# Patient Record
Sex: Female | Born: 1955 | Race: Black or African American | Hispanic: No | Marital: Married | Smoking: Never smoker
Health system: Southern US, Community
[De-identification: ages and names within clinical notes are randomized; demographics above are authoritative.]

## PROBLEM LIST (undated history)

## (undated) DIAGNOSIS — E785 Hyperlipidemia, unspecified: Secondary | ICD-10-CM

## (undated) DIAGNOSIS — D72819 Decreased white blood cell count, unspecified: Secondary | ICD-10-CM

## (undated) DIAGNOSIS — R202 Paresthesia of skin: Secondary | ICD-10-CM

## (undated) DIAGNOSIS — D696 Thrombocytopenia, unspecified: Secondary | ICD-10-CM

## (undated) DIAGNOSIS — G5602 Carpal tunnel syndrome, left upper limb: Secondary | ICD-10-CM

## (undated) DIAGNOSIS — D1803 Hemangioma of intra-abdominal structures: Secondary | ICD-10-CM

## (undated) DIAGNOSIS — K635 Polyp of colon: Secondary | ICD-10-CM

## (undated) DIAGNOSIS — E039 Hypothyroidism, unspecified: Secondary | ICD-10-CM

## (undated) DIAGNOSIS — F419 Anxiety disorder, unspecified: Secondary | ICD-10-CM

## (undated) DIAGNOSIS — M8580 Other specified disorders of bone density and structure, unspecified site: Secondary | ICD-10-CM

## (undated) DIAGNOSIS — K219 Gastro-esophageal reflux disease without esophagitis: Secondary | ICD-10-CM

## (undated) DIAGNOSIS — E049 Nontoxic goiter, unspecified: Secondary | ICD-10-CM

## (undated) HISTORY — DX: Decreased white blood cell count, unspecified: D72.819

## (undated) HISTORY — DX: Polyp of colon: K63.5

## (undated) HISTORY — DX: Hypothyroidism, unspecified: E03.9

## (undated) HISTORY — DX: Paresthesia of skin: R20.2

## (undated) HISTORY — DX: Carpal tunnel syndrome, left upper limb: G56.02

## (undated) HISTORY — DX: Hyperlipidemia, unspecified: E78.5

## (undated) HISTORY — DX: Hemangioma of intra-abdominal structures: D18.03

## (undated) HISTORY — PX: MOUTH SURGERY: SHX715

## (undated) HISTORY — DX: Other specified disorders of bone density and structure, unspecified site: M85.80

## (undated) HISTORY — DX: Gastro-esophageal reflux disease without esophagitis: K21.9

## (undated) HISTORY — DX: Thrombocytopenia, unspecified: D69.6

## (undated) HISTORY — DX: Nontoxic goiter, unspecified: E04.9

## (undated) HISTORY — DX: Anxiety disorder, unspecified: F41.9

---

## 1999-09-18 ENCOUNTER — Other Ambulatory Visit: Admission: RE | Admit: 1999-09-18 | Discharge: 1999-09-18 | Payer: Self-pay | Admitting: Family Medicine

## 2000-10-25 ENCOUNTER — Other Ambulatory Visit: Admission: RE | Admit: 2000-10-25 | Discharge: 2000-10-25 | Payer: Self-pay | Admitting: Family Medicine

## 2000-10-26 ENCOUNTER — Encounter: Admission: RE | Admit: 2000-10-26 | Discharge: 2000-10-26 | Payer: Self-pay | Admitting: Family Medicine

## 2000-10-26 ENCOUNTER — Encounter: Payer: Self-pay | Admitting: Family Medicine

## 2000-11-16 ENCOUNTER — Encounter: Payer: Self-pay | Admitting: Family Medicine

## 2000-11-16 ENCOUNTER — Ambulatory Visit (HOSPITAL_COMMUNITY): Admission: RE | Admit: 2000-11-16 | Discharge: 2000-11-16 | Payer: Self-pay | Admitting: Family Medicine

## 2001-02-21 ENCOUNTER — Encounter: Admission: RE | Admit: 2001-02-21 | Discharge: 2001-02-21 | Payer: Self-pay | Admitting: *Deleted

## 2001-11-30 ENCOUNTER — Other Ambulatory Visit: Admission: RE | Admit: 2001-11-30 | Discharge: 2001-11-30 | Payer: Self-pay | Admitting: Family Medicine

## 2002-07-25 ENCOUNTER — Encounter: Payer: Self-pay | Admitting: Family Medicine

## 2002-07-25 ENCOUNTER — Encounter: Admission: RE | Admit: 2002-07-25 | Discharge: 2002-07-25 | Payer: Self-pay | Admitting: Family Medicine

## 2003-01-18 ENCOUNTER — Other Ambulatory Visit: Admission: RE | Admit: 2003-01-18 | Discharge: 2003-01-18 | Payer: Self-pay | Admitting: Family Medicine

## 2003-01-29 ENCOUNTER — Encounter: Admission: RE | Admit: 2003-01-29 | Discharge: 2003-01-29 | Payer: Self-pay | Admitting: Family Medicine

## 2003-01-29 ENCOUNTER — Encounter: Payer: Self-pay | Admitting: Family Medicine

## 2004-04-07 ENCOUNTER — Encounter: Admission: RE | Admit: 2004-04-07 | Discharge: 2004-04-07 | Payer: Self-pay | Admitting: Family Medicine

## 2005-07-19 ENCOUNTER — Other Ambulatory Visit: Admission: RE | Admit: 2005-07-19 | Discharge: 2005-07-19 | Payer: Self-pay | Admitting: Family Medicine

## 2007-08-29 ENCOUNTER — Encounter: Admission: RE | Admit: 2007-08-29 | Discharge: 2007-08-29 | Payer: Self-pay | Admitting: Family Medicine

## 2007-09-01 ENCOUNTER — Encounter: Admission: RE | Admit: 2007-09-01 | Discharge: 2007-09-01 | Payer: Self-pay | Admitting: Gastroenterology

## 2007-10-24 ENCOUNTER — Encounter: Admission: RE | Admit: 2007-10-24 | Discharge: 2007-10-24 | Payer: Self-pay | Admitting: Family Medicine

## 2008-06-28 ENCOUNTER — Encounter: Admission: RE | Admit: 2008-06-28 | Discharge: 2008-06-28 | Payer: Self-pay | Admitting: Family Medicine

## 2009-01-07 ENCOUNTER — Encounter: Admission: RE | Admit: 2009-01-07 | Discharge: 2009-01-07 | Payer: Self-pay | Admitting: Internal Medicine

## 2009-04-17 HISTORY — PX: THYROIDECTOMY, PARTIAL: SHX18

## 2009-04-17 HISTORY — PX: TOTAL THYROIDECTOMY: SHX2547

## 2009-04-18 ENCOUNTER — Ambulatory Visit (HOSPITAL_COMMUNITY): Admission: RE | Admit: 2009-04-18 | Discharge: 2009-04-19 | Payer: Self-pay | Admitting: Surgery

## 2009-04-18 ENCOUNTER — Encounter (INDEPENDENT_AMBULATORY_CARE_PROVIDER_SITE_OTHER): Payer: Self-pay | Admitting: Surgery

## 2010-10-20 ENCOUNTER — Ambulatory Visit: Admit: 2010-10-20 | Payer: Self-pay | Admitting: Internal Medicine

## 2010-10-22 ENCOUNTER — Ambulatory Visit
Admission: RE | Admit: 2010-10-22 | Discharge: 2010-10-22 | Payer: Self-pay | Source: Home / Self Care | Attending: Internal Medicine | Admitting: Internal Medicine

## 2010-11-03 ENCOUNTER — Ambulatory Visit: Payer: Self-pay | Admitting: Internal Medicine

## 2010-12-31 ENCOUNTER — Other Ambulatory Visit: Payer: Federal, State, Local not specified - PPO | Admitting: Internal Medicine

## 2010-12-31 ENCOUNTER — Ambulatory Visit: Payer: Self-pay | Admitting: Internal Medicine

## 2010-12-31 DIAGNOSIS — E7800 Pure hypercholesterolemia, unspecified: Secondary | ICD-10-CM

## 2011-01-07 ENCOUNTER — Ambulatory Visit: Payer: Federal, State, Local not specified - PPO | Admitting: Internal Medicine

## 2011-01-07 DIAGNOSIS — E039 Hypothyroidism, unspecified: Secondary | ICD-10-CM

## 2011-01-07 DIAGNOSIS — E789 Disorder of lipoprotein metabolism, unspecified: Secondary | ICD-10-CM

## 2011-01-24 LAB — CALCIUM: Calcium: 9.2 mg/dL (ref 8.4–10.5)

## 2011-01-25 LAB — BASIC METABOLIC PANEL
CO2: 32 mEq/L (ref 19–32)
Calcium: 9.4 mg/dL (ref 8.4–10.5)
Creatinine, Ser: 0.84 mg/dL (ref 0.4–1.2)
Glucose, Bld: 93 mg/dL (ref 70–99)
Potassium: 4.3 mEq/L (ref 3.5–5.1)

## 2011-01-25 LAB — DIFFERENTIAL
Basophils Absolute: 0 10*3/uL (ref 0.0–0.1)
Basophils Relative: 0 % (ref 0–1)
Eosinophils Absolute: 0 10*3/uL (ref 0.0–0.7)
Eosinophils Relative: 1 % (ref 0–5)
Lymphocytes Relative: 36 % (ref 12–46)
Monocytes Absolute: 0.3 10*3/uL (ref 0.1–1.0)
Neutrophils Relative %: 54 % (ref 43–77)

## 2011-01-25 LAB — URINE MICROSCOPIC-ADD ON

## 2011-01-25 LAB — PROTIME-INR
INR: 1 (ref 0.00–1.49)
Prothrombin Time: 13.9 seconds (ref 11.6–15.2)

## 2011-01-25 LAB — URINALYSIS, ROUTINE W REFLEX MICROSCOPIC
Bilirubin Urine: NEGATIVE
Hgb urine dipstick: NEGATIVE
Nitrite: NEGATIVE
Protein, ur: NEGATIVE mg/dL
Specific Gravity, Urine: 1.014 (ref 1.005–1.030)

## 2011-01-25 LAB — CBC
Hemoglobin: 13.5 g/dL (ref 12.0–15.0)
RBC: 4.24 MIL/uL (ref 3.87–5.11)

## 2011-03-02 NOTE — Op Note (Signed)
[name], [name]                ACCOUNT NO.:  [id number]   MEDICAL RECORD NO.:  [id number]          PATIENT TYPE:  OIB   LOCATION:  5151                         FACILITY:  [facility]   PHYSICIAN:  Velora Heckler, MD      DATE OF BIRTH:  Feb 16, 1956   DATE OF PROCEDURE:  04/18/2009  DATE OF DISCHARGE:                               OPERATIVE REPORT   PREOPERATIVE DIAGNOSIS:  Thyroid goiter, dominant left thyroid nodule.   POSTOPERATIVE DIAGNOSIS:  Thyroid goiter, dominant left thyroid nodule.   PROCEDURE:  Total thyroidectomy.   SURGEON:  Velora Heckler, MD, FACS   ASSISTANT:  Adolph Pollack, MD, FACS   ANESTHESIA:  General per Bedelia Person, MD   ESTIMATED BLOOD LOSS:  Minimal.   PREPARATION:  ChloraPrep.   COMPLICATIONS:  None.   INDICATIONS:  The patient is a 55 year old black female from [place],  [place].  The patient has a known thyroid goiter with a dominant  left thyroid nodule.  She has been followed for approximately 5 years.  There has been a gradual increase in size of the nodules.  She has  developed mild compressive symptoms.  The patient now comes to surgery  for thyroidectomy.   BODY OF REPORT:  Procedure was done in OR #16 at the [facility].  The patient was brought to the operating room and  placed in the supine position on the operating room table.  Following  administration of general anesthesia, the patient was positioned and  then prepped and draped in the usual strict aseptic fashion.  After  ascertaining that an adequate level of anesthesia had been achieved, a  Kocher incision was made with a #15 blade.  Dissection was carried  through subcutaneous tissues and platysma.  Hemostasis was obtained with  the electrocautery.  Skin flaps were elevated cephalad and caudad from  the thyroid notch to the sternal notch.  A Mahorner self-retaining  retractor was placed for exposure.  Strap muscles were incised in the  midline.   Dissection was begun on the right side.  Right thyroid lobe  was exposed.  Strap muscles were reflected laterally.  Middle thyroid  vein was divided between medium Ligaclips with the harmonic scalpel.  Gland was gently mobilized with blunt dissection.  Inferior parathyroid  gland was identified on the thyroid capsule.  It was gently mobilized on  its vascular pedicle and preserved.  Superior pole vessels were  dissected out and divided between medium Ligaclips with the harmonic  scalpel.  Gland was rolled anteriorly.  Inferior venous tributaries were  ligated in continuity with 2-0 silk ties and divided with the harmonic  scalpel.  Gland was rolled further anteriorly.  Branches of the inferior  thyroid artery were divided between small Ligaclips with the harmonic  scalpel.  Recurrent nerve was identified and preserved.  The ligament of  Allyson Sabal was transected with the electrocautery, and the gland was  mobilized up and onto the anterior trachea.  Isthmus was mobilized  across the midline.  There was no pyramidal lobe.   Next,  we turned our attention to the left thyroid lobe.  Strap muscles  were again reflected laterally.  The left thyroid lobe was actually  larger in size with the inferior pole nodule extending into the anterior  mediastinum, but behind the head of the clavicle.  This was gently  mobilized with blunt dissection.  Middle thyroid vein was divided  between Ligaclips with the harmonic scalpel.  Inferior venous  tributaries were ligated in continuity with 2-0 silk ties and divided  with the harmonic scalpel.  Superior pole was dissected out.  Superior  parathyroid gland was identified and preserved.  Vascular tributaries to  the superior pole were divided between Ligaclips with the harmonic  scalpel.  Gland was rolled anteriorly.  Recurrent nerve was identified  and preserved.  Branches of the inferior thyroid artery were divided  between small Ligaclips.  Ligament of Allyson Sabal was  transected with the  electrocautery, and the gland was mobilized up and onto the anterior  trachea.  It was excised off the trachea using the harmonic scalpel for  hemostasis.  Sutures were used to mark the left superior pole.  The  entire thyroid gland was submitted to Pathology for review.   Neck was irrigated with warm saline.  Good hemostasis was achieved  throughout.  Surgicel was placed in the operative field bilaterally.  Strap muscles were reapproximated in the midline with interrupted 3-0  Vicryl sutures.  Platysma was closed with interrupted 3-0 Vicryl  sutures.  Skin was closed with running 4-0 Monocryl subcuticular suture.  Wound was washed and dried, and benzoin and Steri-Strips were applied.  Sterile dressings were applied.  The patient was awakened from  anesthesia and brought to the recovery room in stable condition.  The  patient tolerated the procedure well.      Velora Heckler, MD  Electronically Signed     TMG/MEDQ  D:  04/18/2009  T:  04/19/2009  Job:  [id number]   cc:   Kendrick Ranch, M.D.

## 2011-04-12 ENCOUNTER — Other Ambulatory Visit: Payer: Self-pay | Admitting: Internal Medicine

## 2011-04-12 ENCOUNTER — Other Ambulatory Visit: Payer: Self-pay

## 2011-04-12 ENCOUNTER — Ambulatory Visit (INDEPENDENT_AMBULATORY_CARE_PROVIDER_SITE_OTHER): Payer: Federal, State, Local not specified - PPO | Admitting: Internal Medicine

## 2011-04-12 DIAGNOSIS — Z113 Encounter for screening for infections with a predominantly sexual mode of transmission: Secondary | ICD-10-CM

## 2011-04-12 DIAGNOSIS — E785 Hyperlipidemia, unspecified: Secondary | ICD-10-CM

## 2011-04-12 DIAGNOSIS — M8580 Other specified disorders of bone density and structure, unspecified site: Secondary | ICD-10-CM

## 2011-04-12 DIAGNOSIS — K769 Liver disease, unspecified: Secondary | ICD-10-CM

## 2011-04-12 DIAGNOSIS — E039 Hypothyroidism, unspecified: Secondary | ICD-10-CM

## 2011-04-12 DIAGNOSIS — Z1272 Encounter for screening for malignant neoplasm of vagina: Secondary | ICD-10-CM

## 2011-04-12 DIAGNOSIS — Z01419 Encounter for gynecological examination (general) (routine) without abnormal findings: Secondary | ICD-10-CM

## 2011-04-12 DIAGNOSIS — M899 Disorder of bone, unspecified: Secondary | ICD-10-CM

## 2011-04-14 ENCOUNTER — Other Ambulatory Visit (HOSPITAL_BASED_OUTPATIENT_CLINIC_OR_DEPARTMENT_OTHER): Payer: Federal, State, Local not specified - PPO

## 2011-04-17 ENCOUNTER — Ambulatory Visit (HOSPITAL_BASED_OUTPATIENT_CLINIC_OR_DEPARTMENT_OTHER)
Admission: RE | Admit: 2011-04-17 | Discharge: 2011-04-17 | Disposition: A | Discharge status: Home / Self Care | Payer: Federal, State, Local not specified - PPO | Source: Ambulatory Visit | Attending: Internal Medicine | Admitting: Internal Medicine

## 2011-04-17 ENCOUNTER — Other Ambulatory Visit (HOSPITAL_BASED_OUTPATIENT_CLINIC_OR_DEPARTMENT_OTHER): Payer: Federal, State, Local not specified - PPO

## 2011-04-17 DIAGNOSIS — Q619 Cystic kidney disease, unspecified: Secondary | ICD-10-CM

## 2011-04-17 DIAGNOSIS — K7689 Other specified diseases of liver: Secondary | ICD-10-CM

## 2011-04-17 DIAGNOSIS — K769 Liver disease, unspecified: Secondary | ICD-10-CM

## 2011-04-17 MED ORDER — GADOBENATE DIMEGLUMINE 529 MG/ML IV SOLN
13.0000 mL | Freq: Once | INTRAVENOUS | Status: AC | PRN
  Administered 2011-04-17: 13 mL via INTRAVENOUS

## 2011-04-19 ENCOUNTER — Ambulatory Visit
Admission: RE | Admit: 2011-04-19 | Discharge: 2011-04-19 | Disposition: A | Discharge status: Home / Self Care | Payer: Federal, State, Local not specified - PPO | Source: Ambulatory Visit | Attending: Internal Medicine | Admitting: Internal Medicine

## 2011-04-19 DIAGNOSIS — M8580 Other specified disorders of bone density and structure, unspecified site: Secondary | ICD-10-CM

## 2011-04-26 ENCOUNTER — Encounter: Payer: Self-pay | Admitting: Internal Medicine

## 2011-04-26 DIAGNOSIS — M8580 Other specified disorders of bone density and structure, unspecified site: Secondary | ICD-10-CM | POA: Insufficient documentation

## 2011-06-28 ENCOUNTER — Encounter: Payer: Self-pay | Admitting: Emergency Medicine

## 2011-07-21 ENCOUNTER — Ambulatory Visit (INDEPENDENT_AMBULATORY_CARE_PROVIDER_SITE_OTHER): Payer: Federal, State, Local not specified - PPO | Admitting: Family Medicine

## 2011-07-21 ENCOUNTER — Encounter: Payer: Self-pay | Admitting: Internal Medicine

## 2011-07-21 VITALS — BP 112/67 | HR 76 | Temp 97.1°F | Resp 12 | Ht 64.25 in | Wt 148.0 lb

## 2011-07-21 DIAGNOSIS — S90121A Contusion of right lesser toe(s) without damage to nail, initial encounter: Secondary | ICD-10-CM

## 2011-07-21 DIAGNOSIS — S90129A Contusion of unspecified lesser toe(s) without damage to nail, initial encounter: Secondary | ICD-10-CM

## 2011-07-21 DIAGNOSIS — J00 Acute nasopharyngitis [common cold]: Secondary | ICD-10-CM

## 2011-07-21 NOTE — Progress Notes (Addendum)
Subjective:    Patient ID: [name], female    DOB: [birth year], 55 y.o.   MRN: [id number]  HPI Patient is a 55-yo AA Female who presents with about 1 week history of cough, some mild congestion, and headaches. She states the worst day was Friday and she missed work that day, it was 5 days ago. She does not have a head ache or sinus pain today. She has a minimally runny nose. She states she has a cough productive of yellow phlegm. She has been using OTC pseudaphed and robitussin, and Nedi Pot for her symptoms. She has had no fevers. She states her husband had a cold too, but no other sick contacts. She has not had her flu shot yet this year.  Additionally, [name] states she stubbed the small toe of her RT foot and was concerned about it. She has been wearing tennis shoes and icing/elevating her foot with decent relief. She is post menopausal but does not calcium or vitamin d.   Review of Systems  Constitutional: Positive for fatigue. Negative for fever, chills, diaphoresis, activity change and appetite change.  HENT: Positive for congestion, rhinorrhea and postnasal drip. Negative for ear pain, nosebleeds, sore throat, facial swelling, sneezing, trouble swallowing, neck pain, neck stiffness, voice change, sinus pressure and ear discharge.   Eyes: Negative for discharge.  Respiratory: Positive for cough. Negative for apnea, choking, chest tightness, shortness of breath, wheezing and stridor.   Cardiovascular: Negative for chest pain.  Gastrointestinal: Negative for nausea and diarrhea.  Musculoskeletal: Negative for myalgias, joint swelling and arthralgias.       Pain of RT 5th toe and along the lateral edge of her foot  Skin: Negative for color change.  Neurological: Negative for dizziness and headaches.       Objective:   Physical Exam  Constitutional: She is oriented to person, place, and time. She appears well-developed and well-nourished. No distress.  HENT:  Head:  Normocephalic and atraumatic.  Right Ear: External ear normal.  Left Ear: External ear normal.  Nose: Nose normal.  Mouth/Throat: Oropharynx is clear and moist. No oropharyngeal exudate.       Small amount of clear fluid noted behind Right ear drum  Eyes: Pupils are equal, round, and reactive to light.  Neck: Normal range of motion.  Cardiovascular: Normal rate, regular rhythm, normal heart sounds and intact distal pulses.  Exam reveals no gallop and no friction rub.   No murmur heard. Pulmonary/Chest: Effort normal and breath sounds normal. No respiratory distress. She has no wheezes. She has no rales.  Abdominal: Soft. Bowel sounds are normal.  Musculoskeletal: Normal range of motion.       RIGHT FOOT: Tenderness along the shaft of the 5th metatarsal. No swelling or deformity noted. Movement/motion of 5th digit normal and did not illicit pain.  Lymphadenopathy:    She has no cervical adenopathy.  Neurological: She is alert and oriented to person, place, and time. She has normal reflexes. No cranial nerve deficit.  Skin: Skin is warm and dry. No rash noted. She is not diaphoretic.          Assessment & Plan:  1. Common Cold: Patient has been treating her symptoms well with OTC medications. She does not have any findings on exam to suggest current need for antibiotics. Discussed continuing her OTC regimen and if her symptoms fail to continue to improve or worsen, or she develops fever, she should follow-up closely. Feel that patient should wait until  she feels better from current symptoms before she gets her flu vaccine so that it does not confuse her current clinical picture if she develops the commonly associated "flu like illness" from the flu vaccination.  2. Contusion Rt 5th Toe: Discussed role of xray to rule out a stress fracture; she is post menopausal and is not on calcium/vit d. She declines xray at this time, but will call back if her foot becomes swollen or symptoms worsen.  Discussed continuing to wear appropriate shoes, use of ice and elevation.  Lucina Mellow, DO Family Practice Physician

## 2011-07-21 NOTE — Patient Instructions (Signed)
Common Cold, Adult An upper respiratory tract infection, or cold, is a viral infection of the air passages to the lung. Colds are contagious, especially during the first 3 or 4 days. Antibiotics cannot cure a cold. Cold germs are spread by coughs, sneezes, and hand to hand contact. A respiratory tract infection usually clears up in a few days, but some people may be sick for a week or two. HOME CARE INSTRUCTIONS  Only take over-the-counter or prescription medicines for pain, discomfort, or fever as directed by your caregiver.   Be careful not to blow your nose too hard. This may cause a nosebleed.   Use a cool-mist humidifier (vaporizer) to increase air moisture. This will make it easier for you to breath. Do not use hot steam.   Rest as much as possible and get plenty of sleep.   Wash your hands often, especially after you blow your nose. Cover your mouth and nose with a tissue when you sneeze or cough.   Drink at least 8 glasses of clear liquids every day, such as water, fruit juices, tea, clear soups, and carbonated beverages.  SEEK MEDICAL CARE IF:  An oral temperature above 100.4 F lasts 4 days or more, and is not controlled by medication.   You have a sore throat that gets worse or you see white or yellow spots in your throat.   Your cough gets worse or lasts more than 10 days.   You have a rash somewhere on your skin. You have large and tender lumps in your neck.   You have an earache or a headache.   You have thick, greenish or yellowish discharge from your nose.   You cough-up thick yellow, green, gray or bloody mucus (secretions).  SEEK IMMEDIATE MEDICAL CARE IF: You have trouble breathing, chest pain, or your skin or nails look gray or blue. MAKE SURE YOU:   Understand these instructions.   Will watch your condition.   Will get help right away if you are not doing well or get worse.  Document Released: 10/01/2000 Document Re-Released: 09/16/2008 [facility] Patient  Information 2011 [place], [place].

## 2011-07-27 ENCOUNTER — Telehealth: Payer: Self-pay | Admitting: Internal Medicine

## 2011-07-27 ENCOUNTER — Other Ambulatory Visit: Payer: Self-pay | Admitting: Internal Medicine

## 2011-07-27 ENCOUNTER — Ambulatory Visit (HOSPITAL_BASED_OUTPATIENT_CLINIC_OR_DEPARTMENT_OTHER)
Admission: RE | Admit: 2011-07-27 | Discharge: 2011-07-27 | Disposition: A | Discharge status: Home / Self Care | Payer: Federal, State, Local not specified - PPO | Source: Ambulatory Visit | Attending: Internal Medicine | Admitting: Internal Medicine

## 2011-07-27 DIAGNOSIS — S99929A Unspecified injury of unspecified foot, initial encounter: Secondary | ICD-10-CM

## 2011-07-27 DIAGNOSIS — S92919A Unspecified fracture of unspecified toe(s), initial encounter for closed fracture: Secondary | ICD-10-CM | POA: Insufficient documentation

## 2011-07-27 DIAGNOSIS — X58XXXA Exposure to other specified factors, initial encounter: Secondary | ICD-10-CM | POA: Insufficient documentation

## 2011-07-27 NOTE — Telephone Encounter (Signed)
Pt would like results of X Ray done on 07/27/2011.  Call back phone number [phone] cell and [phone] home.

## 2011-07-27 NOTE — Telephone Encounter (Signed)
Pt states she was told by Dr. Natale Milch that if the pain does not get any better in her right baby toe to call back to get an xray; she called back this morning to get one. She would like one today, best contact is [phone]. Thanks

## 2011-07-27 NOTE — Telephone Encounter (Signed)
No answer on home phone, no answering machine.  Left message on [name]'s cell phone, okay to come in for x-ray of foot at her convenience, no appointment needed.  She is to call with any questions

## 2011-07-27 NOTE — Telephone Encounter (Signed)
Pt would like to know results of xray.  Aware it will be morning before DDS reviews

## 2011-07-27 NOTE — Telephone Encounter (Signed)
See Dr. Vira Blanco note- okay to order xray?

## 2011-07-27 NOTE — Telephone Encounter (Signed)
I ordered X-ray  For her

## 2011-07-28 ENCOUNTER — Telehealth: Payer: Self-pay | Admitting: Internal Medicine

## 2011-07-28 NOTE — Telephone Encounter (Signed)
Spoke with pt and infomed of oblique fracture of toe.  Pain better.  Advised to pad the toe and wear flat soft support shoes.  Will refer to Dr. Charlett Blake to follow for healing.

## 2011-07-28 NOTE — Telephone Encounter (Signed)
Appointment scheduled with Dr. Charlett Blake Monday 10/15@ 330pm.  Pt aware and agreeable to appt.  Will fax records to [phone]

## 2011-08-14 ENCOUNTER — Other Ambulatory Visit: Payer: Self-pay | Admitting: Internal Medicine

## 2011-08-14 DIAGNOSIS — E785 Hyperlipidemia, unspecified: Secondary | ICD-10-CM

## 2011-11-04 ENCOUNTER — Telehealth: Payer: Self-pay | Admitting: Emergency Medicine

## 2011-11-04 DIAGNOSIS — E039 Hypothyroidism, unspecified: Secondary | ICD-10-CM

## 2011-11-04 MED ORDER — LEVOTHYROXINE SODIUM 50 MCG PO TABS: 50.0000 ug | ORAL_TABLET | Freq: Every day | ORAL | Status: DC

## 2011-11-04 NOTE — Telephone Encounter (Signed)
Call pt as she was supposed to come in to have TSH done back in august or September and I do not see result.  I will give her 30 days worth but she needs to have TSH done.   How long has she been out of meds???  TSH  With dx hypothyroidism  Thanks

## 2011-11-04 NOTE — Telephone Encounter (Signed)
Left message on voicemail that rx escribed at .  Also she will need labs, advised to call office for date.  She noted on the phone this morning that she had taken her last pill today.

## 2011-11-04 NOTE — Telephone Encounter (Signed)
[name] called, requested refill on levothyroxine.  She states she is completely out of medication

## 2011-11-08 NOTE — Telephone Encounter (Signed)
Spoke with [name], she is aware to go to [facility] for labs at her convenience before medication requires refill

## 2011-11-11 LAB — TSH: TSH: 16.396 u[IU]/mL — ABNORMAL HIGH (ref 0.350–4.500)

## 2011-11-15 ENCOUNTER — Telehealth: Payer: Self-pay | Admitting: Internal Medicine

## 2011-11-15 DIAGNOSIS — E039 Hypothyroidism, unspecified: Secondary | ICD-10-CM

## 2011-11-15 MED ORDER — LEVOTHYROXINE SODIUM 50 MCG PO TABS: ORAL_TABLET | ORAL | Status: DC

## 2011-11-15 NOTE — Telephone Encounter (Signed)
Spoke with pt about Elevated TSH.   Sheis to take 1 and 1/2 of 50 mcg M-Fri and only 1 talbet on Sat Sun.  She will see me in 2 months at CPE exam and will recheck TSH.  She voices understanding and states she will schedule CPE

## 2011-11-22 ENCOUNTER — Other Ambulatory Visit: Payer: Self-pay | Admitting: Internal Medicine

## 2011-11-22 DIAGNOSIS — E785 Hyperlipidemia, unspecified: Secondary | ICD-10-CM

## 2012-02-29 ENCOUNTER — Other Ambulatory Visit: Payer: Self-pay | Admitting: *Deleted

## 2012-02-29 DIAGNOSIS — E039 Hypothyroidism, unspecified: Secondary | ICD-10-CM

## 2012-02-29 NOTE — Telephone Encounter (Signed)
Received fax requesting refill, last refill 01/31/2012, last office visit 10/12

## 2012-03-02 MED ORDER — LEVOTHYROXINE SODIUM 50 MCG PO TABS: ORAL_TABLET | ORAL | Status: DC

## 2012-03-06 ENCOUNTER — Other Ambulatory Visit: Payer: Self-pay | Admitting: Internal Medicine

## 2012-03-07 NOTE — Telephone Encounter (Signed)
[name]  Call this pt and tell her that I refilled her cholesterol med for 30 days.  She needs to see me in office to recheck her liver studies and to check her thyroid studies as they are overdue.     Give her a 30 minute appt and message back with date  thanks

## 2012-03-07 NOTE — Telephone Encounter (Signed)
Called Timya and notified her RX renewed but needs appointment for labs., appointment given for 03/21/12 and scheduled annual physical/pap for June 26 at 3:30

## 2012-03-21 ENCOUNTER — Encounter: Payer: Self-pay | Admitting: Internal Medicine

## 2012-03-21 ENCOUNTER — Ambulatory Visit (INDEPENDENT_AMBULATORY_CARE_PROVIDER_SITE_OTHER): Payer: Federal, State, Local not specified - PPO | Admitting: Internal Medicine

## 2012-03-21 VITALS — BP 106/73 | HR 67 | Temp 97.2°F | Wt 150.0 lb

## 2012-03-21 DIAGNOSIS — D696 Thrombocytopenia, unspecified: Secondary | ICD-10-CM | POA: Insufficient documentation

## 2012-03-21 DIAGNOSIS — D72819 Decreased white blood cell count, unspecified: Secondary | ICD-10-CM | POA: Insufficient documentation

## 2012-03-21 DIAGNOSIS — E559 Vitamin D deficiency, unspecified: Secondary | ICD-10-CM | POA: Insufficient documentation

## 2012-03-21 DIAGNOSIS — K7689 Other specified diseases of liver: Secondary | ICD-10-CM

## 2012-03-21 DIAGNOSIS — E039 Hypothyroidism, unspecified: Secondary | ICD-10-CM | POA: Insufficient documentation

## 2012-03-21 DIAGNOSIS — R894 Abnormal immunological findings in specimens from other organs, systems and tissues: Secondary | ICD-10-CM

## 2012-03-21 DIAGNOSIS — Z86010 Personal history of colonic polyps: Secondary | ICD-10-CM | POA: Insufficient documentation

## 2012-03-21 DIAGNOSIS — R768 Other specified abnormal immunological findings in serum: Secondary | ICD-10-CM | POA: Insufficient documentation

## 2012-03-21 DIAGNOSIS — Z780 Asymptomatic menopausal state: Secondary | ICD-10-CM | POA: Insufficient documentation

## 2012-03-21 DIAGNOSIS — K769 Liver disease, unspecified: Secondary | ICD-10-CM | POA: Insufficient documentation

## 2012-03-21 DIAGNOSIS — E049 Nontoxic goiter, unspecified: Secondary | ICD-10-CM | POA: Insufficient documentation

## 2012-03-21 DIAGNOSIS — E785 Hyperlipidemia, unspecified: Secondary | ICD-10-CM | POA: Insufficient documentation

## 2012-03-21 LAB — CBC WITH DIFFERENTIAL/PLATELET
Hemoglobin: 13.4 g/dL (ref 12.0–15.0)
Lymphs Abs: 1.4 10*3/uL (ref 0.7–4.0)
MCH: 30.8 pg (ref 26.0–34.0)
Monocytes Absolute: 0.3 10*3/uL (ref 0.1–1.0)
Monocytes Relative: 8 % (ref 3–12)
Neutro Abs: 1.8 10*3/uL (ref 1.7–7.7)
Platelets: 171 10*3/uL (ref 150–400)
RDW: 12 % (ref 11.5–15.5)
WBC: 3.5 10*3/uL — ABNORMAL LOW (ref 4.0–10.5)

## 2012-03-21 NOTE — Patient Instructions (Signed)
See me for cpe appt

## 2012-03-21 NOTE — Progress Notes (Signed)
  Subjective:    Patient ID: [name], female    DOB: [birth year], 56 y.o.   MRN: [id number]  HPI  [name] is here for follow up It has been a while since she has had her TSH checked.  She is on 50 mcg and has been taking daily.  No report of hair loss dry skin or weight gain  Allergies  Allergen Reactions  . Codeine Nausea Only  . Lactose Intolerance (Gi)    Past Medical History  Diagnosis Date  . Goiter   . Hyperlipidemia   . Osteopenia   . Liver hemangioma   . Leukopenia   . Vitamin d deficiency   . Thrombocytopenia   . Colon polyps    Past Surgical History  Procedure Date  . Thyroidectomy, partial 04/2009   History   Social History  . Marital Status: Married    Spouse Name: N/A    Number of Children: N/A  . Years of Education: N/A   Occupational History  . Not on file.   Social History Main Topics  . Smoking status: Never Smoker   . Smokeless tobacco: Never Used  . Alcohol Use: No  . Drug Use: No  . Sexually Active: Yes    Birth Control/ Protection: Post-menopausal   Other Topics Concern  . Not on file   Social History Narrative  . No narrative on file   Family History  Problem Relation Age of Onset  . Cervical cancer Mother   . Diabetes Father   . Cervical cancer Sister   . Cancer Brother     brain  . Diabetes Brother   . Arthritis Sister     rheumatoid   Patient Active Problem List  Diagnoses  . Osteopenia   Current Outpatient Prescriptions on File Prior to Visit  Medication Sig Dispense Refill  . Calcium Carbonate-Vitamin D (CALCIUM + D PO) Take 1 tablet by mouth daily.      Marland Kitchen levothyroxine (SYNTHROID, LEVOTHROID) 50 MCG tablet Take one and 1/2 tablet on Mon-Fri and one tablet on Sat. And Sun  90 tablet  0  . simvastatin (ZOCOR) 10 MG tablet TAKE ONE TABLET BY MOUTH ONE TIME DAILY  30 tablet  0      Review of Systems    see HPI Objective:   Physical Exam Physical Exam  Nursing note and vitals reviewed.  Constitutional: She is  oriented to person, place, and time. She appears well-developed and well-nourished.  HENT:  Head: Normocephalic and atraumatic.  Cardiovascular: Normal rate and regular rhythm. Exam reveals no gallop and no friction rub.  No murmur heard.  Pulmonary/Chest: Breath sounds normal. She has no wheezes. She has no rales.  Neurological: She is alert and oriented to person, place, and time.  Skin: Skin is warm and dry.  Psychiatric: She has a normal mood and affect. Her behavior is normal.           Assessment & Plan:  Hypothyroidism  Check today  Hyperlipidemia  Check today  Keep appt for CPE

## 2012-03-21 NOTE — Progress Notes (Signed)
Needs updated labs to renew meds- has yearly 04/12/12

## 2012-03-22 LAB — COMPREHENSIVE METABOLIC PANEL
AST: 28 U/L (ref 0–37)
CO2: 28 mEq/L (ref 19–32)
Calcium: 9 mg/dL (ref 8.4–10.5)
Chloride: 103 mEq/L (ref 96–112)
Creat: 0.98 mg/dL (ref 0.50–1.10)
Total Protein: 7.4 g/dL (ref 6.0–8.3)

## 2012-03-22 LAB — LIPID PANEL
Cholesterol: 226 mg/dL — ABNORMAL HIGH (ref 0–200)
HDL: 67 mg/dL (ref >39)
Total CHOL/HDL Ratio: 3.4 Ratio
Triglycerides: 57 mg/dL (ref <150)

## 2012-03-22 LAB — VITAMIN D 25 HYDROXY (VIT D DEFICIENCY, FRACTURES): Vit D, 25-Hydroxy: 28 ng/mL — ABNORMAL LOW (ref 30–89)

## 2012-03-23 ENCOUNTER — Telehealth: Payer: Self-pay | Admitting: Internal Medicine

## 2012-03-23 NOTE — Telephone Encounter (Deleted)
Spoke with pt and informed of results of all labs.  Will have her take 1 and 1/2 thyroid pill on Sat and Sun and 1 pill M-Fri.  She voices understanding  Keep her upcoming appt with me

## 2012-03-23 NOTE — Telephone Encounter (Signed)
Spoke with pt and informed of thyroid results.  She is only taking one pill a day.  Counseled to take on pill M-F and 1 and 1/2 pill Sat and Sun. She voices understanding

## 2012-03-28 ENCOUNTER — Telehealth: Payer: Self-pay | Admitting: *Deleted

## 2012-03-28 NOTE — Telephone Encounter (Signed)
Copy of labs mailed to pt's home address. 

## 2012-04-12 ENCOUNTER — Ambulatory Visit (INDEPENDENT_AMBULATORY_CARE_PROVIDER_SITE_OTHER): Payer: Federal, State, Local not specified - PPO | Admitting: Internal Medicine

## 2012-04-12 ENCOUNTER — Encounter: Payer: Self-pay | Admitting: Internal Medicine

## 2012-04-12 VITALS — BP 108/68 | HR 76 | Temp 97.1°F | Resp 16 | Ht 64.0 in | Wt 150.0 lb

## 2012-04-12 DIAGNOSIS — E785 Hyperlipidemia, unspecified: Secondary | ICD-10-CM

## 2012-04-12 DIAGNOSIS — Z01419 Encounter for gynecological examination (general) (routine) without abnormal findings: Secondary | ICD-10-CM

## 2012-04-12 DIAGNOSIS — Z780 Asymptomatic menopausal state: Secondary | ICD-10-CM

## 2012-04-12 DIAGNOSIS — M899 Disorder of bone, unspecified: Secondary | ICD-10-CM

## 2012-04-12 DIAGNOSIS — M8580 Other specified disorders of bone density and structure, unspecified site: Secondary | ICD-10-CM

## 2012-04-12 DIAGNOSIS — Z860101 Personal history of adenomatous and serrated colon polyps: Secondary | ICD-10-CM

## 2012-04-12 DIAGNOSIS — Z8049 Family history of malignant neoplasm of other genital organs: Secondary | ICD-10-CM

## 2012-04-12 DIAGNOSIS — E039 Hypothyroidism, unspecified: Secondary | ICD-10-CM

## 2012-04-12 DIAGNOSIS — M949 Disorder of cartilage, unspecified: Secondary | ICD-10-CM

## 2012-04-12 DIAGNOSIS — Z86010 Personal history of colonic polyps: Secondary | ICD-10-CM

## 2012-04-12 NOTE — Patient Instructions (Addendum)
Follow DASH heart healthy diet  See me in 6 months  Have pap smear every year

## 2012-04-12 NOTE — Progress Notes (Signed)
Subjective:    Patient ID: [name], female    DOB: 1956/06/04, 56 y.o.   MRN: [id number]  HPI  Tara is here for comprehensive eval.  Overall doing well.  She does have an intermittant pain in L thigh that comes and goes for several years.  Pain occurs in muscle belly of L thigh.  Not present when walking or exercising.  Stays in muscle and feels like a cramp at times.  No edema or redness  See labs. She has mammgram scheduled in July  FH of cervical cancer in mother and sister  Osteopenia on bone density 2012.  She is taking her calcium and Vitamin d  She is due for colonoscopy 2014  Allergies  Allergen Reactions  . Codeine Nausea Only  . Lactose Intolerance (Gi)    Past Medical History  Diagnosis Date  . Goiter   . Hyperlipidemia   . Osteopenia   . Liver hemangioma   . Leukopenia   . Vitamin d deficiency   . Thrombocytopenia   . Colon polyps    Past Surgical History  Procedure Date  . Thyroidectomy, partial 04/2009   History   Social History  . Marital Status: Married    Spouse Name: N/A    Number of Children: N/A  . Years of Education: N/A   Occupational History  . Not on file.   Social History Main Topics  . Smoking status: Never Smoker   . Smokeless tobacco: Never Used  . Alcohol Use: No  . Drug Use: No  . Sexually Active: Yes    Birth Control/ Protection: Post-menopausal   Other Topics Concern  . Not on file   Social History Narrative  . No narrative on file   Family History  Problem Relation Age of Onset  . Cervical cancer Mother   . Diabetes Father   . Cervical cancer Sister   . Cancer Brother     brain  . Diabetes Brother   . Arthritis Sister     rheumatoid   Patient Active Problem List  Diagnosis  . Osteopenia  . Unspecified hypothyroidism  . Goiter  . Other and unspecified hyperlipidemia  . Menopause  . Positive ANA (antinuclear antibody)  . Leukopenia  . Thrombocytopenia  . Hepatic lesion  . Vitamin d deficiency  .  Hx of adenomatous colonic polyps  . Family history of cervical cancer   Current Outpatient Prescriptions on File Prior to Visit  Medication Sig Dispense Refill  . Calcium Carbonate-Vitamin D (CALCIUM + D PO) Take 1 tablet by mouth daily.      Marland Kitchen levothyroxine (SYNTHROID, LEVOTHROID) 50 MCG tablet Take one and 1/2 tablet on Mon-Fri and one tablet on Sat. And Sun  90 tablet  0  . simvastatin (ZOCOR) 10 MG tablet TAKE ONE TABLET BY MOUTH ONE TIME DAILY  30 tablet  0      Review of Systems  Constitutional: Negative.   HENT: Negative.   Eyes: Negative.   Respiratory: Negative.   Cardiovascular: Negative.   Gastrointestinal: Negative.   Genitourinary: Negative.   Musculoskeletal: Positive for myalgias.  Skin: Negative.   Neurological: Negative.   Hematological: Negative.   Psychiatric/Behavioral: Negative.        Objective:   Physical Exam Physical Exam  Vital signs and nursing note reviewed  Constitutional: She is oriented to person, place, and time. She appears well-developed and well-nourished. She is cooperative.  HENT:  Head: Normocephalic and atraumatic.  Right Ear: Tympanic membrane normal.  Left Ear: Tympanic membrane normal.  Nose: Nose normal.  Mouth/Throat: Oropharynx is clear and moist and mucous membranes are normal. No oropharyngeal exudate or posterior oropharyngeal erythema.  Eyes: Conjunctivae and EOM are normal. Pupils are equal, round, and reactive to light.  Neck: Neck supple. No JVD present. Carotid bruit is not present. No mass and no thyromegaly present.  Cardiovascular: Regular rhythm, normal heart sounds, intact distal pulses and normal pulses.  Exam reveals no gallop and no friction rub.   No murmur heard. Pulses:      Dorsalis pedis pulses are 2+ on the right side, and 2+ on the left side.  Pulmonary/Chest: Breath sounds normal. She has no wheezes. She has no rhonchi. She has no rales. Right breast exhibits no mass, no nipple discharge and no skin  change. Left breast exhibits no mass, no nipple discharge and no skin change.  Abdominal: Soft. Bowel sounds are normal. She exhibits no distension and no mass. There is no hepatosplenomegaly. There is no tenderness. There is no CVA tenderness.  Genitourinary: Rectum normal, vagina normal and uterus normal. Rectal exam shows no mass. Guaiac negative stool. No labial fusion. There is no lesion on the right labia. There is no lesion on the left labia. Cervix exhibits no motion tenderness. Right adnexum displays no mass, no tenderness and no fullness. Left adnexum displays no mass, no tenderness and no fullness. No erythema around the vagina.  Musculoskeletal:       No active synovitis to any joint.   No pain to deep palpation of quadricep muscle in L thigh Lymphadenopathy:       Right cervical: No superficial cervical adenopathy present.      Left cervical: No superficial cervical adenopathy present.       Right axillary: No pectoral and no lateral adenopathy present.       Left axillary: No pectoral and no lateral adenopathy present.      Right: No inguinal adenopathy present.       Left: No inguinal adenopathy present.  Neurological: She is alert and oriented to person, place, and time. She has normal strength and normal reflexes. No cranial nerve deficit or sensory deficit. She displays a negative Romberg sign. Coordination and gait normal.  Skin: Skin is warm and dry. No abrasion, no bruising, no ecchymosis and no rash noted. No cyanosis. Nails show no clubbing.  Psychiatric: She has a normal mood and affect. Her speech is normal and behavior is normal.          Assessment & Plan:  HM  See above she had TD in 2006 per her report  Pap today  Hyperlipidemia on low dose simvastaatin.  Leg pain predates statin therapy  Osteopenia on Calcium Vitamin D  Hypothyroidism  Leukopenia  Mild    Hepatic lesion 6mm likely cyst on MRI of 2012  History of adenomatous polyps   FH cervical CA in  mother and sister.  couseled pt to be sure to have pap every year       Assessment & Plan:

## 2012-04-13 ENCOUNTER — Other Ambulatory Visit: Payer: Self-pay | Admitting: *Deleted

## 2012-04-13 MED ORDER — SIMVASTATIN 10 MG PO TABS: 10.0000 mg | ORAL_TABLET | Freq: Every day | ORAL | Status: DC

## 2012-04-19 ENCOUNTER — Encounter: Payer: Self-pay | Admitting: *Deleted

## 2012-04-19 NOTE — Progress Notes (Signed)
Pt notified via mail that pap was normal and to repeat pap in 1 year.

## 2012-05-01 ENCOUNTER — Encounter: Payer: Self-pay | Admitting: *Deleted

## 2012-06-26 ENCOUNTER — Telehealth: Payer: Self-pay | Admitting: Internal Medicine

## 2012-06-26 ENCOUNTER — Ambulatory Visit (INDEPENDENT_AMBULATORY_CARE_PROVIDER_SITE_OTHER): Payer: Federal, State, Local not specified - PPO | Admitting: Family Medicine

## 2012-06-26 VITALS — BP 114/68 | HR 63 | Temp 97.4°F | Resp 16 | Ht 64.0 in | Wt 148.8 lb

## 2012-06-26 DIAGNOSIS — R42 Dizziness and giddiness: Secondary | ICD-10-CM

## 2012-06-26 MED ORDER — MECLIZINE HCL 25 MG PO TABS: 25.0000 mg | ORAL_TABLET | Freq: Three times a day (TID) | ORAL | Status: AC | PRN

## 2012-06-26 NOTE — Progress Notes (Signed)
[facility] [address], [place] [address] [phone]  Date:  06/26/2012   Name:  [name]   DOB:  12/12/1955   MRN:  [id number]  PCP:  Levon Hedger, MD    Chief Complaint: Dizziness   History of Present Illness:  [name] is a 56 y.o. very pleasant female patient who presents with the following:  She is here today to evaluate vertigo symptoms. She noted it this morning when she rolled over in bed- a spinning sensation that resolved within seconds when she holds her head still.  She has been diagnosed with vertigo in the past with the same symptoms.  She last had this a few years ago.  It went away on it's own once, and she saw the doctor once and received medication- meclizine sounds familiar.  No headache, no changes in her vision or hearing, mild nausea but no vomiting.    She has some tinnitus symptoms, but this has been present on and off for a few years.   No new medications.    Patient Active Problem List  Diagnosis  . Osteopenia  . Unspecified hypothyroidism  . Goiter  . Other and unspecified hyperlipidemia  . Menopause  . Positive ANA (antinuclear antibody)  . Leukopenia  . Thrombocytopenia  . Hepatic lesion  . Vitamin d deficiency  . Hx of adenomatous colonic polyps  . Family history of cervical cancer    Past Medical History  Diagnosis Date  . Goiter   . Hyperlipidemia   . Osteopenia   . Liver hemangioma   . Leukopenia   . Vitamin d deficiency   . Thrombocytopenia   . Colon polyps     Past Surgical History  Procedure Date  . Thyroidectomy, partial 04/2009    History  Substance Use Topics  . Smoking status: Never Smoker   . Smokeless tobacco: Never Used  . Alcohol Use: No    Family History  Problem Relation Age of Onset  . Cervical cancer Mother   . Diabetes Father   . Cervical cancer Sister   . Cancer Brother     brain  . Diabetes Brother   . Arthritis Sister     rheumatoid    Allergies    Allergen Reactions  . Codeine Nausea Only  . Lactose Intolerance (Gi)     Medication list has been reviewed and updated.  Current Outpatient Prescriptions on File Prior to Visit  Medication Sig Dispense Refill  . Calcium Carbonate-Vitamin D (CALCIUM + D PO) Take 1 tablet by mouth daily.      . simvastatin (ZOCOR) 10 MG tablet Take 1 tablet (10 mg total) by mouth at bedtime.  90 tablet  1  . DISCONTD: levothyroxine (SYNTHROID, LEVOTHROID) 50 MCG tablet Take one and 1/2 tablet on Mon-Fri and one tablet on Sat. And Sun  90 tablet  0    Review of Systems:  As per HPI- otherwise negative.   Physical Examination: Filed Vitals:   06/26/12 0945  BP: 114/68  Pulse: 63  Temp: 97.4 F (36.3 C)  Resp: 16   Filed Vitals:   06/26/12 0945  Height: 5\' 4"  (1.626 m)  Weight: 148 lb 12.8 oz (67.495 kg)   Body mass index is 25.54 kg/(m^2). Ideal Body Weight: Weight in (lb) to have BMI = 25: 145.3   GEN: WDWN, NAD, Non-toxic, A & O x 3 HEENT: Atraumatic, Normocephalic. Neck supple. No masses, No LAD.  Tm and oropharynx  wnl, PEERL, EOMI Ears and Nose: No external deformity. CV: RRR, No M/G/R. No JVD. No thrill. No extra heart sounds. PULM: CTA B, no wheezes, crackles, rhonchi. No retractions. No resp. distress. No accessory muscle use. ABD: S, NT, ND, +BS. No rebound. No HSM. EXTR: No c/c/e NEURO Normal gait.  Complete neuro exam normal- normal strength, sensation and DTR all extremities.  Negative arm drift, normal romberg.  Facial sensation and motion normal.  Mildly positive dix- halpike to left only.  PSYCH: Normally interactive. Conversant. Not depressed or anxious appearing.  Calm demeanor.    Assessment and Plan: 1. Vertigo  meclizine (ANTIVERT) 25 MG tablet   likely BPPV.  See hand- out and discussed with patient in detail. Her daughter drove her here today.  Note for work.   Patient (or parent if minor) instructed to return to clinic or call if not better in 2-3 day(s). Sooner  if worse or if any other symptoms develop.     Abbe Amsterdam, MD

## 2012-06-26 NOTE — Telephone Encounter (Signed)
Levothyroxine Sodium (Tab) SYNTHROID, LEVOTHROID 50 MCG Take one and 1/2 tablet on Mon-Fri and one tablet on Sat. And Sun Pt needs refill on meds, per pt she took her last one.... She needs this called into [facility] off [address] [facility] [address] in [place], [place].Marland KitchenMarland Kitchen

## 2012-06-26 NOTE — Telephone Encounter (Signed)
Pt called into saying she was having dizzy spells and she would like to be seen today.... Per Dr. Constance Goltz inform pt we can fit her in on 09/10, but if she feels worse please go to the local urgent care... Pt spoke understanding and states she will most likely go the urgent care.Marland KitchenMarland Kitchen

## 2012-06-26 NOTE — Patient Instructions (Addendum)
  Let me know if you are not better within a few days- Sooner if worse.     Benign Positional Vertigo Vertigo means you feel like you or your surroundings are moving when they are not. Benign positional vertigo is the most common form of vertigo. Benign means that the cause of your condition is not serious. Benign positional vertigo is more common in older adults. CAUSES  Benign positional vertigo is the result of an upset in the labyrinth system. This is an area in the middle ear that helps control your balance. This may be caused by a viral infection, head injury, or repetitive motion. However, often no specific cause is found. SYMPTOMS  Symptoms of benign positional vertigo occur when you move your head or eyes in different directions. Some of the symptoms may include:  Loss of balance and falls.   Vomiting.   Blurred vision.   Dizziness.   Nausea.   Involuntary eye movements (nystagmus).  DIAGNOSIS  Benign positional vertigo is usually diagnosed by physical exam. If the specific cause of your benign positional vertigo is unknown, your caregiver may perform imaging tests, such as magnetic resonance imaging (MRI) or computed tomography (CT). TREATMENT  Your caregiver may recommend movements or procedures to correct the benign positional vertigo. Medicines such as meclizine, benzodiazepines, and medicines for nausea may be used to treat your symptoms. In rare cases, if your symptoms are caused by certain conditions that affect the inner ear, you may need surgery. HOME CARE INSTRUCTIONS   Follow your caregiver's instructions.   Move slowly. Do not make sudden body or head movements.   Avoid driving.   Avoid operating heavy machinery.   Avoid performing any tasks that would be dangerous to you or others during a vertigo episode.   Drink enough fluids to keep your urine clear or pale yellow.  SEEK IMMEDIATE MEDICAL CARE IF:   You develop problems with walking, weakness,  numbness, or using your arms, hands, or legs.   You have difficulty speaking.   You develop severe headaches.   Your nausea or vomiting continues or gets worse.   You develop visual changes.   Your family or friends notice any behavioral changes.   Your condition gets worse.   You have a fever.   You develop a stiff neck or sensitivity to light.  MAKE SURE YOU:   Understand these instructions.   Will watch your condition.   Will get help right away if you are not doing well or get worse.  Document Released: 07/12/2006 Document Revised: 09/23/2011 Document Reviewed: 06/24/2011 [facility] Patient Information 2012 [place], [place].

## 2012-06-28 ENCOUNTER — Other Ambulatory Visit: Payer: Self-pay | Admitting: Internal Medicine

## 2012-06-28 NOTE — Telephone Encounter (Signed)
Pt is calling to see the status of her medication Levothyroxine Sodium (Tab) SYNTHROID, LEVOTHROID 50 MCG Take one and 1 tablet on Mon-Fri and 1 1/2 tablet on Sat. And Wynelle Link  She needs this called in, per pt she has taken her last pill on 09/09... Please call her and let know the status of her meds... Thanks

## 2012-06-29 MED ORDER — LEVOTHYROXINE SODIUM 50 MCG PO TABS: 50.0000 ug | ORAL_TABLET | Freq: Every day | ORAL | Status: DC

## 2012-09-05 ENCOUNTER — Telehealth: Payer: Self-pay | Admitting: *Deleted

## 2012-09-05 NOTE — Telephone Encounter (Signed)
Called pt to clarify appt.

## 2012-09-06 ENCOUNTER — Telehealth: Payer: Self-pay | Admitting: *Deleted

## 2012-09-06 ENCOUNTER — Ambulatory Visit: Payer: Federal, State, Local not specified - PPO | Admitting: Internal Medicine

## 2012-09-06 DIAGNOSIS — E039 Hypothyroidism, unspecified: Secondary | ICD-10-CM

## 2012-09-12 NOTE — Telephone Encounter (Signed)
Repeat TSH

## 2012-09-13 ENCOUNTER — Telehealth: Payer: Self-pay | Admitting: Internal Medicine

## 2012-09-13 NOTE — Telephone Encounter (Signed)
Left message at work and on cell phone to call regarding thyroid blood work

## 2012-09-18 ENCOUNTER — Telehealth: Payer: Self-pay | Admitting: Internal Medicine

## 2012-09-18 DIAGNOSIS — R42 Dizziness and giddiness: Secondary | ICD-10-CM

## 2012-09-18 NOTE — Telephone Encounter (Signed)
Spoke with pt and informed of TSH results.  Will change dose to 50 mcg one tablet Monday through Thursday and 1 and 1/2 on Fri, Sat, Sunday.    She notes her vertigo has returned.  Has spinning sensation when lying down.  She requests a referral to Dr. Narda Bonds.  Will set up referral

## 2012-10-23 ENCOUNTER — Other Ambulatory Visit: Payer: Self-pay | Admitting: *Deleted

## 2012-10-23 MED ORDER — SIMVASTATIN 10 MG PO TABS: 10.0000 mg | ORAL_TABLET | Freq: Every day | ORAL | Status: DC

## 2012-10-23 NOTE — Telephone Encounter (Signed)
Refill request

## 2012-12-20 ENCOUNTER — Other Ambulatory Visit: Payer: Self-pay | Admitting: *Deleted

## 2012-12-20 MED ORDER — LEVOTHYROXINE SODIUM 50 MCG PO TABS: 50.0000 ug | ORAL_TABLET | Freq: Every day | ORAL | Status: DC

## 2012-12-20 NOTE — Telephone Encounter (Signed)
Refill request

## 2013-01-09 ENCOUNTER — Ambulatory Visit (INDEPENDENT_AMBULATORY_CARE_PROVIDER_SITE_OTHER): Payer: Federal, State, Local not specified - PPO | Admitting: Internal Medicine

## 2013-01-09 ENCOUNTER — Encounter: Payer: Self-pay | Admitting: Internal Medicine

## 2013-01-09 VITALS — BP 115/81 | HR 82 | Temp 97.1°F | Resp 18 | Ht 64.0 in | Wt 150.0 lb

## 2013-01-09 DIAGNOSIS — E039 Hypothyroidism, unspecified: Secondary | ICD-10-CM

## 2013-01-09 DIAGNOSIS — F4323 Adjustment disorder with mixed anxiety and depressed mood: Secondary | ICD-10-CM

## 2013-01-09 LAB — CBC WITH DIFFERENTIAL/PLATELET
HCT: 41.2 % (ref 36.0–46.0)
Hemoglobin: 13.8 g/dL (ref 12.0–15.0)
Lymphs Abs: 1.5 10*3/uL (ref 0.7–4.0)
Monocytes Relative: 10 % (ref 3–12)
Neutro Abs: 1.8 10*3/uL (ref 1.7–7.7)
Neutrophils Relative %: 47 % (ref 43–77)
RBC: 4.78 MIL/uL (ref 3.87–5.11)

## 2013-01-09 LAB — TSH: TSH: 7.354 u[IU]/mL — ABNORMAL HIGH (ref 0.350–4.500)

## 2013-01-09 LAB — COMPREHENSIVE METABOLIC PANEL
Albumin: 4.7 g/dL (ref 3.5–5.2)
CO2: 30 mEq/L (ref 19–32)
Calcium: 9.5 mg/dL (ref 8.4–10.5)
Chloride: 103 mEq/L (ref 96–112)
Glucose, Bld: 75 mg/dL (ref 70–99)
Potassium: 4.1 mEq/L (ref 3.5–5.3)
Sodium: 139 mEq/L (ref 135–145)
Total Protein: 7.5 g/dL (ref 6.0–8.3)

## 2013-01-09 MED ORDER — LORAZEPAM 1 MG PO TABS: ORAL_TABLET | ORAL | Status: DC

## 2013-01-09 NOTE — Progress Notes (Signed)
Subjective:    Patient ID: [name], female    DOB: 1956/02/04, 57 y.o.   MRN: [id number]  HPI  [name] is here for acute visit.  She is tearful most of interview.  Describes tremendous stress at work and having problems with her supervisor.  She has worked for past 7 years at [employer].  She has upcoming meeting with Deputy Supervisor this Friday.    Trouble sleeping,  Anxious at work  "I feel on edge" .  Heart beating fast and SOB when anxious .  Most recent has some depressed feelings.  No S/H ideation  No psychotic features.  She is seeing the EAP supervisor at work who she feels she has a good relationship with.    Allergies  Allergen Reactions  . Codeine Nausea Only  . Lactose Intolerance (Gi)    Past Medical History  Diagnosis Date  . Goiter   . Hyperlipidemia   . Osteopenia   . Liver hemangioma   . Leukopenia   . Vitamin D deficiency   . Thrombocytopenia   . Colon polyps    Past Surgical History  Procedure Laterality Date  . Thyroidectomy, partial  04/2009   History   Social History  . Marital Status: Married    Spouse Name: N/A    Number of Children: N/A  . Years of Education: N/A   Occupational History  . Not on file.   Social History Main Topics  . Smoking status: Never Smoker   . Smokeless tobacco: Never Used  . Alcohol Use: No  . Drug Use: No  . Sexually Active: Yes    Birth Control/ Protection: Post-menopausal   Other Topics Concern  . Not on file   Social History Narrative  . No narrative on file   Family History  Problem Relation Age of Onset  . Cervical cancer Mother   . Diabetes Father   . Cervical cancer Sister   . Cancer Brother     brain  . Diabetes Brother   . Arthritis Sister     rheumatoid   Patient Active Problem List  Diagnosis  . Osteopenia  . Unspecified hypothyroidism  . Goiter  . Other and unspecified hyperlipidemia  . Menopause  . Positive ANA (antinuclear antibody)  . Leukopenia  .  Thrombocytopenia  . Hepatic lesion  . Vitamin d deficiency  . Hx of adenomatous colonic polyps  . Family history of cervical cancer   Current Outpatient Prescriptions on File Prior to Visit  Medication Sig Dispense Refill  . Calcium Carbonate-Vitamin D (CALCIUM + D PO) Take 1 tablet by mouth daily.      Marland Kitchen levothyroxine (SYNTHROID, LEVOTHROID) 50 MCG tablet Take 1 tablet (50 mcg total) by mouth daily. Take one and 1 tablet on Mon-Fri and 1 1/2 tablet on Sat. And Sun  100 tablet  2  . simvastatin (ZOCOR) 10 MG tablet Take 1 tablet (10 mg total) by mouth at bedtime.  90 tablet  1   No current facility-administered medications on file prior to visit.     Review of Systems    see HPI Objective:   Physical Exam  Physical Exam  Nursing note and vitals reviewed.  Constitutional: She is oriented to person, place, and time. She appears well-developed and well-nourished.  HENT:  Head: Normocephalic and atraumatic.  Cardiovascular: Normal rate and regular rhythm. Exam reveals no gallop and no friction rub.  No murmur heard.  Pulmonary/Chest: Breath sounds normal. She has no wheezes.  She has no rales.  Neurological: She is alert and oriented to person, place, and time.  Skin: Skin is warm and dry.  Psychiatric: Tearful.  Affect blunted            Assessment & Plan:  Anxiety with mixed anxious and depressed features  Will RX  Ativan  1/2 tablet bid prn.  Ok to repeat dose in 30 mins if not better  Hypothyroidism  Will check TSh today  See me in 4-6 weeks or sooner prn

## 2013-01-09 NOTE — Patient Instructions (Addendum)
See me in 4-6 weeks

## 2013-01-12 ENCOUNTER — Telehealth: Payer: Self-pay | Admitting: Internal Medicine

## 2013-01-12 NOTE — Telephone Encounter (Signed)
Pt states she needs a work excuse; she states she spoke to DDS about being out of work for a week or two... Pt states she can either pick up the letter or mail it... She will call back on Monday... Pt was advise that the nurse was at another clinic today... Ad

## 2013-01-15 ENCOUNTER — Telehealth: Payer: Self-pay | Admitting: Internal Medicine

## 2013-01-15 MED ORDER — LEVOTHYROXINE SODIUM 50 MCG PO TABS: ORAL_TABLET | ORAL | Status: DC

## 2013-01-15 NOTE — Telephone Encounter (Signed)
Spoke with pt and informed of thyroid results.  She is to take her levothyroxine 1 and 1/2 tabs M,W,FRI and one tablet T, TH, Sat, Sun

## 2013-01-23 ENCOUNTER — Ambulatory Visit (INDEPENDENT_AMBULATORY_CARE_PROVIDER_SITE_OTHER): Payer: Federal, State, Local not specified - PPO | Admitting: Internal Medicine

## 2013-01-23 ENCOUNTER — Encounter: Payer: Self-pay | Admitting: Internal Medicine

## 2013-01-23 VITALS — BP 117/78 | HR 68 | Temp 97.0°F | Resp 18 | Wt 150.0 lb

## 2013-01-23 DIAGNOSIS — E039 Hypothyroidism, unspecified: Secondary | ICD-10-CM

## 2013-01-23 DIAGNOSIS — F4323 Adjustment disorder with mixed anxiety and depressed mood: Secondary | ICD-10-CM

## 2013-01-23 NOTE — Patient Instructions (Addendum)
See me in 6-8 weeks 

## 2013-01-23 NOTE — Progress Notes (Signed)
Subjective:    Patient ID: [name], female    DOB: [birth year], 57 y.o.   MRN: [id number]  HPI  [name] is here for follow up of adjustment disorder.  She had a meeting with her Environmental education officer and tells me she was "written up and reprimanded" at that meeting.  This causes a lot of anxiety when she is with her immediate supervisor or the Environmental education officer. Ativan helps - she has used a lot at work but finds she does not need it during the weekend.    She has sad moods no anhedonia and states she is "a little depressed"  She does not want meds at this point.    She would  Like a few days off of work and thinks this would help.  No S/H ideation  See TSH  She is now taking 1 and 1/2 tabs M,W,Fri  Of 50 mcg of levothyroxine and one tablet other days.   Allergies  Allergen Reactions  . Codeine Nausea Only  . Lactose Intolerance (Gi)    Past Medical History  Diagnosis Date  . Goiter   . Hyperlipidemia   . Osteopenia   . Liver hemangioma   . Leukopenia   . Vitamin D deficiency   . Thrombocytopenia   . Colon polyps    Past Surgical History  Procedure Laterality Date  . Thyroidectomy, partial  04/2009   History   Social History  . Marital Status: Married    Spouse Name: N/A    Number of Children: N/A  . Years of Education: N/A   Occupational History  . Not on file.   Social History Main Topics  . Smoking status: Never Smoker   . Smokeless tobacco: Never Used  . Alcohol Use: No  . Drug Use: No  . Sexually Active: Yes    Birth Control/ Protection: Post-menopausal   Other Topics Concern  . Not on file   Social History Narrative  . No narrative on file   Family History  Problem Relation Age of Onset  . Cervical cancer Mother   . Diabetes Father   . Cervical cancer Sister   . Cancer Brother     brain  . Diabetes Brother   . Arthritis Sister     rheumatoid   Patient Active Problem List  Diagnosis  . Osteopenia  . Unspecified hypothyroidism  . Goiter  .  Other and unspecified hyperlipidemia  . Menopause  . Positive ANA (antinuclear antibody)  . Leukopenia  . Thrombocytopenia  . Hepatic lesion  . Vitamin d deficiency  . Hx of adenomatous colonic polyps  . Family history of cervical cancer  . Adjustment disorder with mixed anxiety and depressed mood   Current Outpatient Prescriptions on File Prior to Visit  Medication Sig Dispense Refill  . Calcium Carbonate-Vitamin D (CALCIUM + D PO) Take 1 tablet by mouth daily.      Marland Kitchen levothyroxine (SYNTHROID, LEVOTHROID) 50 MCG tablet Take one and 1/2 tablet on M,W,Fri and one tablet on T,Th,Sat, Sun  100 tablet  2  . LORazepam (ATIVAN) 1 MG tablet Take 1/2 tablet as needed every 12 hours.  May repeat in 30 mins if needed  20 tablet  1  . simvastatin (ZOCOR) 10 MG tablet Take 1 tablet (10 mg total) by mouth at bedtime.  90 tablet  1   No current facility-administered medications on file prior to visit.      Review of Systems See HPI    Objective:  Physical Exam Physical Exam  Nursing note and vitals reviewed.  Constitutional: She is oriented to person, place, and time. She appears well-developed and well-nourished.  HENT:  Head: Normocephalic and atraumatic.  Cardiovascular: Normal rate and regular rhythm. Exam reveals no gallop and no friction rub.  No murmur heard.  Pulmonary/Chest: Breath sounds normal. She has no wheezes. She has no rales.  Neurological: She is alert and oriented to person, place, and time.  Skin: Skin is warm and dry.  Psychiatric: She has a normal mood and affect flat today. Her behavior is normal.         Assessment & Plan:  ADj disorder with mixed anxiety/depression  She declines an antidepressant today.   She does not meet criteria for MDD.  She is to call office if any worsening depression  See min in 6-8 weeks  Hypothyroidism:    Will recheck TSH in 6-8 weeks

## 2013-03-06 ENCOUNTER — Ambulatory Visit (INDEPENDENT_AMBULATORY_CARE_PROVIDER_SITE_OTHER): Payer: Federal, State, Local not specified - PPO | Admitting: Internal Medicine

## 2013-03-06 ENCOUNTER — Encounter: Payer: Self-pay | Admitting: Internal Medicine

## 2013-03-06 VITALS — BP 111/73 | HR 84 | Temp 97.3°F | Resp 16 | Wt 150.0 lb

## 2013-03-06 DIAGNOSIS — E039 Hypothyroidism, unspecified: Secondary | ICD-10-CM

## 2013-03-06 DIAGNOSIS — F4323 Adjustment disorder with mixed anxiety and depressed mood: Secondary | ICD-10-CM

## 2013-03-06 NOTE — Patient Instructions (Addendum)
See me for CPE 

## 2013-03-06 NOTE — Progress Notes (Signed)
Subjective:    Patient ID: [name], female    DOB: Apr 22, 1956, 57 y.o.   MRN: [id number]  HPI  [name] is here for follow up.  She is smiling,  States mood is better no depression.  She has not needed her Ativan  In the last couple of weeks.  Work situation somewhat better  See TSH  Will check today.    Allergies  Allergen Reactions  . Codeine Nausea Only  . Lactose Intolerance (Gi)    Past Medical History  Diagnosis Date  . Goiter   . Hyperlipidemia   . Osteopenia   . Liver hemangioma   . Leukopenia   . Vitamin D deficiency   . Thrombocytopenia   . Colon polyps    Past Surgical History  Procedure Laterality Date  . Thyroidectomy, partial  04/2009   History   Social History  . Marital Status: Married    Spouse Name: N/A    Number of Children: N/A  . Years of Education: N/A   Occupational History  . Not on file.   Social History Main Topics  . Smoking status: Never Smoker   . Smokeless tobacco: Never Used  . Alcohol Use: No  . Drug Use: No  . Sexually Active: Yes    Birth Control/ Protection: Post-menopausal   Other Topics Concern  . Not on file   Social History Narrative  . No narrative on file   Family History  Problem Relation Age of Onset  . Cervical cancer Mother   . Diabetes Father   . Cervical cancer Sister   . Cancer Brother     brain  . Diabetes Brother   . Arthritis Sister     rheumatoid   Patient Active Problem List   Diagnosis Date Noted  . Adjustment disorder with mixed anxiety and depressed mood 01/09/2013  . Family history of cervical cancer 04/12/2012  . Unspecified hypothyroidism 03/21/2012  . Goiter 03/21/2012  . Other and unspecified hyperlipidemia 03/21/2012  . Menopause 03/21/2012  . Positive ANA (antinuclear antibody) 03/21/2012  . Leukopenia 03/21/2012  . Thrombocytopenia 03/21/2012  . Hepatic lesion 03/21/2012  . Vitamin d deficiency 03/21/2012  . Hx of adenomatous colonic polyps 03/21/2012  . Osteopenia  04/26/2011   Current Outpatient Prescriptions on File Prior to Visit  Medication Sig Dispense Refill  . Calcium Carbonate-Vitamin D (CALCIUM + D PO) Take 1 tablet by mouth daily.      Marland Kitchen levothyroxine (SYNTHROID, LEVOTHROID) 50 MCG tablet Take one and 1/2 tablet on M,W,Fri and one tablet on T,Th,Sat, Sun  100 tablet  2  . LORazepam (ATIVAN) 1 MG tablet Take 1/2 tablet as needed every 12 hours.  May repeat in 30 mins if needed  20 tablet  1  . simvastatin (ZOCOR) 10 MG tablet Take 1 tablet (10 mg total) by mouth at bedtime.  90 tablet  1   No current facility-administered medications on file prior to visit.      Review of Systems See HPI    Objective:   Physical Exam Physical Exam  Nursing note and vitals reviewed.  Constitutional: She is oriented to person, place, and time. She appears well-developed and well-nourished.  HENT:  Head: Normocephalic and atraumatic.  Cardiovascular: Normal rate and regular rhythm. Exam reveals no gallop and no friction rub.  No murmur heard.  Pulmonary/Chest: Breath sounds normal. She has no wheezes. She has no rales.  Neurological: She is alert and oriented to person, place, and time.  Skin:  Skin is warm and dry.  Psychiatric: She has a normal mood and affect. Her behavior is normal.       Assessment & Plan:  Adjustment disorder  Improving  Use Ativan prn  Hypothyroidism  Will check TSH today

## 2013-03-07 LAB — TSH: TSH: 0.337 u[IU]/mL — ABNORMAL LOW (ref 0.350–4.500)

## 2013-03-14 ENCOUNTER — Other Ambulatory Visit: Payer: Self-pay | Admitting: Internal Medicine

## 2013-03-14 MED ORDER — LEVOTHYROXINE SODIUM 50 MCG PO TABS: ORAL_TABLET | ORAL | Status: DC

## 2013-03-28 ENCOUNTER — Other Ambulatory Visit: Payer: Self-pay | Admitting: Internal Medicine

## 2013-03-28 NOTE — Telephone Encounter (Signed)
Refill request

## 2013-04-02 ENCOUNTER — Other Ambulatory Visit (INDEPENDENT_AMBULATORY_CARE_PROVIDER_SITE_OTHER): Payer: Federal, State, Local not specified - PPO | Admitting: *Deleted

## 2013-04-02 DIAGNOSIS — N390 Urinary tract infection, site not specified: Secondary | ICD-10-CM

## 2013-04-02 MED ORDER — CIPROFLOXACIN HCL 500 MG PO TABS: 500.0000 mg | ORAL_TABLET | Freq: Two times a day (BID) | ORAL | Status: DC

## 2013-04-02 NOTE — Progress Notes (Signed)
Pt presents to office today to provide a urine specimen. Pt reports that she has had frequent painful urination denies fever or flank pain or noticeable hematuria. Advised pt that per Dr Constance Goltz an abx will be sent to pharmacy and return appt for July 2 has already been made. Stressed importance of keeping her F/U appt to make sure hematuria and bacteria have resolved. Pt voiced understanding

## 2013-04-03 LAB — URINE CULTURE: Colony Count: NO GROWTH

## 2013-04-18 ENCOUNTER — Encounter: Payer: Self-pay | Admitting: Internal Medicine

## 2013-04-18 ENCOUNTER — Ambulatory Visit (INDEPENDENT_AMBULATORY_CARE_PROVIDER_SITE_OTHER): Payer: Federal, State, Local not specified - PPO | Admitting: Internal Medicine

## 2013-04-18 VITALS — BP 108/69 | HR 66 | Temp 97.7°F | Resp 18 | Wt 150.0 lb

## 2013-04-18 DIAGNOSIS — Z87440 Personal history of urinary (tract) infections: Secondary | ICD-10-CM

## 2013-04-18 DIAGNOSIS — E039 Hypothyroidism, unspecified: Secondary | ICD-10-CM

## 2013-04-18 LAB — POCT URINALYSIS DIPSTICK
Bilirubin, UA: NEGATIVE
Glucose, UA: NEGATIVE
Ketones, UA: NEGATIVE
Spec Grav, UA: 1.015

## 2013-04-18 NOTE — Progress Notes (Signed)
Subjective:    Patient ID: [name], female    DOB: 01-20-1956, 57 y.o.   MRN: [id number]  HPI  [name] is here for follow up of  UTI symptoms -urine culture showed no growth.  [name] is asymptomatic  See TSH.  [name] is taking  Her Synthorid 1 and 1/2 table M,W,Fri with one tab tues, thurs, sat, sun.  [name] has no symptoms of palpitations  Situational anxiety  [name] uses a rare prn ativan when needed.  Work situation somewhat better.  [name] took 1/2 ativan when [name] flew to [facility]  Allergies  Allergen Reactions  . Codeine Nausea Only  . Lactose Intolerance (Gi)    Past Medical History  Diagnosis Date  . Goiter   . Hyperlipidemia   . Osteopenia   . Liver hemangioma   . Leukopenia   . Vitamin D deficiency   . Thrombocytopenia   . Colon polyps    Past Surgical History  Procedure Laterality Date  . Thyroidectomy, partial  04/2009   History   Social History  . Marital Status: Married    Spouse Name: N/A    Number of Children: N/A  . Years of Education: N/A   Occupational History  . Not on file.   Social History Main Topics  . Smoking status: Never Smoker   . Smokeless tobacco: Never Used  . Alcohol Use: No  . Drug Use: No  . Sexually Active: Yes    Birth Control/ Protection: Post-menopausal   Other Topics Concern  . Not on file   Social History Narrative  . No narrative on file   Family History  Problem Relation Age of Onset  . Cervical cancer Mother   . Diabetes Father   . Cervical cancer Sister   . Cancer Brother     brain  . Diabetes Brother   . Arthritis Sister     rheumatoid   Patient Active Problem List   Diagnosis Date Noted  . Adjustment disorder with mixed anxiety and depressed mood 01/09/2013  . Family history of cervical cancer 04/12/2012  . Unspecified hypothyroidism 03/21/2012  . Goiter 03/21/2012  . Other and unspecified hyperlipidemia 03/21/2012  . Menopause 03/21/2012  . Positive ANA (antinuclear antibody) 03/21/2012  . Leukopenia  03/21/2012  . Thrombocytopenia 03/21/2012  . Hepatic lesion 03/21/2012  . Vitamin d deficiency 03/21/2012  . Hx of adenomatous colonic polyps 03/21/2012  . Osteopenia 04/26/2011   Current Outpatient Prescriptions on File Prior to Visit  Medication Sig Dispense Refill  . Calcium Carbonate-Vitamin D (CALCIUM + D PO) Take 1 tablet by mouth daily.      Marland Kitchen levothyroxine (SYNTHROID, LEVOTHROID) 50 MCG tablet Take one and 1/2 tablet on M,W,Fri and one tablet on T,Th,Sat, Sun  100 tablet  2  . LORazepam (ATIVAN) 1 MG tablet Take 1/2 tablet as needed every 12 hours.  May repeat in 30 mins if needed  20 tablet  1  . simvastatin (ZOCOR) 10 MG tablet TAKE ONE TABLET BY MOUTH AT BEDTIME  90 tablet  0   No current facility-administered medications on file prior to visit.       Review of Systems See HPI    Objective:   Physical Exam Physical Exam  Nursing note and vitals reviewed.  Constitutional: [name] is oriented to person, place, and time. [name] appears well-developed and well-nourished.  HENT:  Head: Normocephalic and atraumatic.  Cardiovascular: Normal rate and regular rhythm. Exam reveals no gallop and no friction rub.  No murmur  heard.  Pulmonary/Chest: Breath sounds normal. [name] has no wheezes. [name] has no rales.  Neurological: [name] is alert and oriented to person, place, and time.  Skin: Skin is warm and dry.  Psychiatric: [name] has a normal mood and affect. Her behavior is normal.              Assessment & Plan:  Memory Argue frequency:   U/A today neg heme  Hypothyroidism:  Will check TSH today  Situational anxiety  Improved  Using rare Ativan

## 2013-04-18 NOTE — Addendum Note (Signed)
Addended by: Mathews Robinsons on: 04/18/2013 09:51 AM   Modules accepted: Orders

## 2013-04-18 NOTE — Patient Instructions (Addendum)
See me as needed 

## 2013-04-23 ENCOUNTER — Encounter: Payer: Self-pay | Admitting: *Deleted

## 2013-04-23 ENCOUNTER — Telehealth: Payer: Self-pay | Admitting: *Deleted

## 2013-04-23 NOTE — Telephone Encounter (Signed)
Message copied by Mathews Robinsons on Mon Apr 23, 2013 10:54 AM ------      Message from: Raechel Chute D      Created: Thu Apr 19, 2013  4:20 PM       [employer] and let her know that her thyroid blood test is normal  No changes to her meds            Ok to mail to her ------

## 2013-04-23 NOTE — Telephone Encounter (Signed)
Notified pt of lab results and copy mailed to pt home address

## 2013-06-07 ENCOUNTER — Ambulatory Visit (INDEPENDENT_AMBULATORY_CARE_PROVIDER_SITE_OTHER): Payer: Federal, State, Local not specified - PPO | Admitting: Internal Medicine

## 2013-06-07 ENCOUNTER — Encounter: Payer: Self-pay | Admitting: Internal Medicine

## 2013-06-07 VITALS — BP 110/74 | HR 64 | Temp 97.7°F | Resp 18 | Wt 146.0 lb

## 2013-06-07 DIAGNOSIS — Z139 Encounter for screening, unspecified: Secondary | ICD-10-CM

## 2013-06-07 DIAGNOSIS — Z124 Encounter for screening for malignant neoplasm of cervix: Secondary | ICD-10-CM

## 2013-06-07 DIAGNOSIS — E039 Hypothyroidism, unspecified: Secondary | ICD-10-CM

## 2013-06-07 DIAGNOSIS — Z0000 Encounter for general adult medical examination without abnormal findings: Secondary | ICD-10-CM

## 2013-06-07 DIAGNOSIS — D696 Thrombocytopenia, unspecified: Secondary | ICD-10-CM

## 2013-06-07 DIAGNOSIS — Z1151 Encounter for screening for human papillomavirus (HPV): Secondary | ICD-10-CM

## 2013-06-07 DIAGNOSIS — M8580 Other specified disorders of bone density and structure, unspecified site: Secondary | ICD-10-CM

## 2013-06-07 DIAGNOSIS — Z23 Encounter for immunization: Secondary | ICD-10-CM

## 2013-06-07 DIAGNOSIS — N950 Postmenopausal bleeding: Secondary | ICD-10-CM

## 2013-06-07 DIAGNOSIS — E559 Vitamin D deficiency, unspecified: Secondary | ICD-10-CM

## 2013-06-07 DIAGNOSIS — Z8049 Family history of malignant neoplasm of other genital organs: Secondary | ICD-10-CM

## 2013-06-07 DIAGNOSIS — M899 Disorder of bone, unspecified: Secondary | ICD-10-CM

## 2013-06-07 LAB — POCT URINALYSIS DIPSTICK
Bilirubin, UA: NEGATIVE
Glucose, UA: NEGATIVE
Ketones, UA: NEGATIVE
Leukocytes, UA: NEGATIVE
pH, UA: 6.5

## 2013-06-07 MED ORDER — OMEPRAZOLE 20 MG PO CPDR: 20.0000 mg | DELAYED_RELEASE_CAPSULE | Freq: Every day | ORAL | Status: DC

## 2013-06-07 NOTE — Progress Notes (Signed)
Subjective:    Patient ID: [name], female    DOB: 09-Dec-1955, 57 y.o.   MRN: [id number]  HPI  [name] is here for CPE  She reports she has been having lots of heartburn symptoms.  Using Pepto bismol.  Usually occurs at night.  Eats dinner around 7:30.  NO change in color of stools  [name] has FH of cervical CA in her mother and one sister.  She denies vaginal spotting.    Allergies  Allergen Reactions  . Codeine Nausea Only  . Lactose Intolerance (Gi)    Past Medical History  Diagnosis Date  . Goiter   . Hyperlipidemia   . Osteopenia   . Liver hemangioma   . Leukopenia   . Vitamin D deficiency   . Thrombocytopenia   . Colon polyps    Past Surgical History  Procedure Laterality Date  . Thyroidectomy, partial  04/2009   History   Social History  . Marital Status: Married    Spouse Name: N/A    Number of Children: N/A  . Years of Education: N/A   Occupational History  . Not on file.   Social History Main Topics  . Smoking status: Never Smoker   . Smokeless tobacco: Never Used  . Alcohol Use: No  . Drug Use: No  . Sexual Activity: Yes    Birth Control/ Protection: Post-menopausal   Other Topics Concern  . Not on file   Social History Narrative  . No narrative on file   Family History  Problem Relation Age of Onset  . Cervical cancer Mother   . Diabetes Father   . Cervical cancer Sister   . Cancer Brother     brain  . Diabetes Brother   . Arthritis Sister     rheumatoid   Patient Active Problem List   Diagnosis Date Noted  . Adjustment disorder with mixed anxiety and depressed mood 01/09/2013  . Family history of cervical cancer 04/12/2012  . Unspecified hypothyroidism 03/21/2012  . Goiter 03/21/2012  . Other and unspecified hyperlipidemia 03/21/2012  . Menopause 03/21/2012  . Positive ANA (antinuclear antibody) 03/21/2012  . Leukopenia 03/21/2012  . Thrombocytopenia 03/21/2012  . Hepatic lesion 03/21/2012  . Vitamin d deficiency  03/21/2012  . Hx of adenomatous colonic polyps 03/21/2012  . Osteopenia 04/26/2011   Current Outpatient Prescriptions on File Prior to Visit  Medication Sig Dispense Refill  . Calcium Carbonate-Vitamin D (CALCIUM + D PO) Take 1 tablet by mouth daily.      Marland Kitchen levothyroxine (SYNTHROID, LEVOTHROID) 50 MCG tablet Take one and 1/2 tablet on M,W,Fri and one tablet on T,Th,Sat, Sun  100 tablet  2  . LORazepam (ATIVAN) 1 MG tablet Take 1/2 tablet as needed every 12 hours.  May repeat in 30 mins if needed  20 tablet  1  . simvastatin (ZOCOR) 10 MG tablet TAKE ONE TABLET BY MOUTH AT BEDTIME  90 tablet  0   No current facility-administered medications on file prior to visit.      Review of Systems  Gastrointestinal: Negative for abdominal pain, blood in stool and anal bleeding.  All other systems reviewed and are negative.       Objective:   Physical Exam  Physical Exam  Vital signs and nursing note reviewed  Constitutional: She is oriented to person, place, and time. She appears well-developed and well-nourished. She is cooperative.  HENT:  Head: Normocephalic and atraumatic.  Right Ear: Tympanic membrane normal.  Left Ear: Tympanic membrane  normal.  Nose: Nose normal.  Mouth/Throat: Oropharynx is clear and moist and mucous membranes are normal. No oropharyngeal exudate or posterior oropharyngeal erythema.  Eyes: Conjunctivae and EOM are normal. Pupils are equal, round, and reactive to light.  Neck: Neck supple. No JVD present. Carotid bruit is not present. No mass and no thyromegaly present.  Cardiovascular: Regular rhythm, normal heart sounds, intact distal pulses and normal pulses.  Exam reveals no gallop and no friction rub.   No murmur heard. Pulses:      Dorsalis pedis pulses are 2+ on the right side, and 2+ on the left side.  Pulmonary/Chest: Breath sounds normal. She has no wheezes. She has no rhonchi. She has no rales. Right breast exhibits no mass, no nipple discharge and no  skin change. Left breast exhibits no mass, no nipple discharge and no skin change.  Abdominal: Soft. Bowel sounds are normal. She exhibits no distension and no mass. There is no hepatosplenomegaly. There is no tenderness. There is no CVA tenderness.  Genitourinary: Rectum normal, vagina normal and uterus normal. Rectal exam shows no mass. Guaiac negative stool. No labial fusion. There is no lesion on the right labia. There is no lesion on the left labia. Cervix exhibits no motion tenderness.  There is blood at the cervical os.   Right adnexum displays no mass, no tenderness and no fullness. Left adnexum displays no mass, no tenderness and no fullness. No erythema around the vagina.  Musculoskeletal:       No active synovitis to any joint.    Lymphadenopathy:       Right cervical: No superficial cervical adenopathy present.      Left cervical: No superficial cervical adenopathy present.       Right axillary: No pectoral and no lateral adenopathy present.       Left axillary: No pectoral and no lateral adenopathy present.      Right: No inguinal adenopathy present.       Left: No inguinal adenopathy present.  Neurological: She is alert and oriented to person, place, and time. She has normal strength and normal reflexes. No cranial nerve deficit or sensory deficit. She displays a negative Romberg sign. Coordination and gait normal.  Skin: Skin is warm and dry. No abrasion, no bruising, no ecchymosis and no rash noted. No cyanosis. Nails show no clubbing.  Psychiatric: She has a normal mood and affect. Her speech is normal and behavior is normal.          Assessment & Plan:   Health Maintenance  Will give Tdap today.  She declines Zostavax.  See scanned report  GERD  Will give omeprazole 20 mg for 8 weeks  Post menopausal bleeding  Will refer to [facility] who she ahs seen in the past  FH cervical CA in mother and sister  Hypothyroidism  She is euthyroid on recent evaluation    Colon  polyps  Followed by Dr. Loreta Ave  Hyperlipidemia  Will check today  Osteopenia continey calcium vitamin D.  She has low risk factors for osteoprososis  Will check again at age 67      Assessment & Plan:

## 2013-06-07 NOTE — Patient Instructions (Addendum)
See me as needed  Will set up referral to gyn

## 2013-06-08 LAB — LIPID PANEL
Cholesterol: 235 mg/dL — ABNORMAL HIGH (ref 0–200)
HDL: 70 mg/dL (ref >39)
Total CHOL/HDL Ratio: 3.4 Ratio
Triglycerides: 52 mg/dL (ref <150)

## 2013-06-08 LAB — BASIC METABOLIC PANEL
CO2: 32 mEq/L (ref 19–32)
Chloride: 99 mEq/L (ref 96–112)
Creat: 0.92 mg/dL (ref 0.50–1.10)
Potassium: 4 mEq/L (ref 3.5–5.3)

## 2013-06-08 LAB — VITAMIN D 25 HYDROXY (VIT D DEFICIENCY, FRACTURES): Vit D, 25-Hydroxy: 28 ng/mL — ABNORMAL LOW (ref 30–89)

## 2013-06-11 ENCOUNTER — Encounter: Payer: Self-pay | Admitting: *Deleted

## 2013-06-11 ENCOUNTER — Telehealth: Payer: Self-pay | Admitting: *Deleted

## 2013-06-11 NOTE — Telephone Encounter (Signed)
Left VM message to return call  

## 2013-06-11 NOTE — Telephone Encounter (Signed)
Message copied by Mathews Robinsons on Mon Jun 11, 2013  4:33 PM ------      Message from: Raechel Chute D      Created: Sat Jun 09, 2013 11:47 PM       Ok to mail to pt.              Call pt and let her know that her cholesterol is slightly higher.  Have her make appt with me in next 4-6 weeks to discuss options ------

## 2013-06-14 ENCOUNTER — Encounter: Payer: Self-pay | Admitting: *Deleted

## 2013-08-06 ENCOUNTER — Other Ambulatory Visit: Payer: Self-pay | Admitting: Internal Medicine

## 2013-08-07 ENCOUNTER — Telehealth: Payer: Self-pay | Admitting: *Deleted

## 2013-08-07 NOTE — Telephone Encounter (Signed)
Refill request see comments below

## 2013-08-07 NOTE — Telephone Encounter (Signed)
Called pt regarding her cholesterol in August. Had left VMM to schedule an appt and did not receive return call. Pt states that she will need to call me back about an appt. Pt has a refill request for her zocor will forward to Dr Constance Goltz

## 2013-09-05 ENCOUNTER — Encounter: Payer: Self-pay | Admitting: Internal Medicine

## 2013-09-05 DIAGNOSIS — K635 Polyp of colon: Secondary | ICD-10-CM | POA: Insufficient documentation

## 2013-09-17 ENCOUNTER — Encounter: Payer: Self-pay | Admitting: *Deleted

## 2013-09-17 ENCOUNTER — Encounter: Payer: Self-pay | Admitting: Internal Medicine

## 2013-11-07 ENCOUNTER — Other Ambulatory Visit: Payer: Self-pay | Admitting: Internal Medicine

## 2013-11-08 NOTE — Telephone Encounter (Signed)
Needs refill on Zocor

## 2013-12-05 ENCOUNTER — Ambulatory Visit (INDEPENDENT_AMBULATORY_CARE_PROVIDER_SITE_OTHER): Payer: Federal, State, Local not specified - PPO | Admitting: Internal Medicine

## 2013-12-05 ENCOUNTER — Encounter: Payer: Self-pay | Admitting: Internal Medicine

## 2013-12-05 VITALS — BP 125/83 | HR 68 | Temp 97.9°F | Resp 18 | Wt 146.0 lb

## 2013-12-05 DIAGNOSIS — K219 Gastro-esophageal reflux disease without esophagitis: Secondary | ICD-10-CM | POA: Insufficient documentation

## 2013-12-05 DIAGNOSIS — H109 Unspecified conjunctivitis: Secondary | ICD-10-CM

## 2013-12-05 DIAGNOSIS — R002 Palpitations: Secondary | ICD-10-CM

## 2013-12-05 DIAGNOSIS — R0789 Other chest pain: Secondary | ICD-10-CM

## 2013-12-05 DIAGNOSIS — I456 Pre-excitation syndrome: Secondary | ICD-10-CM | POA: Insufficient documentation

## 2013-12-05 MED ORDER — PANTOPRAZOLE SODIUM 20 MG PO TBEC: 20.0000 mg | DELAYED_RELEASE_TABLET | Freq: Every day | ORAL | Status: DC

## 2013-12-05 MED ORDER — CIPROFLOXACIN HCL 0.3 % OP SOLN: OPHTHALMIC | Status: DC

## 2013-12-05 MED ORDER — LORAZEPAM 1 MG PO TABS: ORAL_TABLET | ORAL | Status: AC

## 2013-12-05 NOTE — Progress Notes (Signed)
Subjective:    Patient ID: [name], female    DOB: [birth year], 57 y.o.   MRN: [id number]  HPI  [name] is here for acute visit.    She began with Red eye left eye yesterday.  No itching,  No pain , no change in vision.    Also reports sharp chest pain midsternal that last a few seconds.   She also note palpitations. She will notice this when she is anxious.  No radiation to left arm or jaw.  No SOB, no Nausea no diaphoresis.   She drinks one cup of coffee daily   She reports more frequent heartburn - using Prilosec now  Allergies  Allergen Reactions  . Codeine Nausea Only  . Lactose Intolerance (Gi)    Past Medical History  Diagnosis Date  . Goiter   . Hyperlipidemia   . Osteopenia   . Liver hemangioma   . Leukopenia   . Vitamin D deficiency   . Thrombocytopenia   . Colon polyps    Past Surgical History  Procedure Laterality Date  . Thyroidectomy, partial  04/2009   History   Social History  . Marital Status: Married    Spouse Name: N/A    Number of Children: N/A  . Years of Education: N/A   Occupational History  . Not on file.   Social History Main Topics  . Smoking status: Never Smoker   . Smokeless tobacco: Never Used  . Alcohol Use: No  . Drug Use: No  . Sexual Activity: Yes    Birth Control/ Protection: Post-menopausal   Other Topics Concern  . Not on file   Social History Narrative  . No narrative on file   Family History  Problem Relation Age of Onset  . Cervical cancer Mother   . Diabetes Father   . Cervical cancer Sister   . Cancer Brother     brain  . Diabetes Brother   . Arthritis Sister     rheumatoid   Patient Active Problem List   Diagnosis Date Noted  . Colon polyps 09/05/2013  . Adjustment disorder with mixed anxiety and depressed mood 01/09/2013  . Family history of cervical cancer 04/12/2012  . Unspecified hypothyroidism 03/21/2012  . Goiter 03/21/2012  . Other and unspecified hyperlipidemia 03/21/2012  . Menopause  03/21/2012  . Positive ANA (antinuclear antibody) 03/21/2012  . Leukopenia 03/21/2012  . Thrombocytopenia 03/21/2012  . Hepatic lesion 03/21/2012  . Vitamin d deficiency 03/21/2012  . Hx of adenomatous colonic polyps 03/21/2012  . Osteopenia 04/26/2011   Current Outpatient Prescriptions on File Prior to Visit  Medication Sig Dispense Refill  . Calcium Carbonate-Vitamin D (CALCIUM + D PO) Take 1 tablet by mouth daily.      Marland Kitchen levothyroxine (SYNTHROID, LEVOTHROID) 50 MCG tablet Take one and 1/2 tablet on M,W,Fri and one tablet on T,Th,Sat, Sun  100 tablet  2  . LORazepam (ATIVAN) 1 MG tablet Take 1/2 tablet as needed every 12 hours.  May repeat in 30 mins if needed  20 tablet  1  . omeprazole (PRILOSEC) 20 MG capsule Take 1 capsule (20 mg total) by mouth daily.  30 capsule  3  . simvastatin (ZOCOR) 10 MG tablet TAKE 1 TABLET BY MOUTH EVERY NIGHT AT BEDTIME  90 tablet  0   No current facility-administered medications on file prior to visit.      Review of Systems    see HPI Objective:   Physical Exam  Physical Exam  Nursing note and vitals reviewed.  Constitutional: She is oriented to person, place, and time. She appears well-developed and well-nourished.  HENT:  Head: Normocephalic and atraumatic.  Cardiovascular: Normal rate and regular rhythm. Exam reveals no gallop and no friction rub.  No murmur heard.  Pulmonary/Chest: Breath sounds normal. She has no wheezes. She has no rales.  Neurological: She is alert and oriented to person, place, and time.  Skin: Skin is warm and dry.  Psychiatric: She has a normal mood and affect. Her behavior is normal.             Assessment & Plan:  Conjunctivitis    Ciloxan  2 gtts qid.  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  Pt would like to discuss with cardiologist  [facility] to refer.  ADvised to avoid caffiene  , ephedrin  GERD  Will change to Protonix 20 mg stop prilosec

## 2013-12-05 NOTE — Patient Instructions (Signed)
Will refer to [facility] cardiology    To pharmacy today    See me if eye symptoms not better

## 2013-12-19 ENCOUNTER — Ambulatory Visit (INDEPENDENT_AMBULATORY_CARE_PROVIDER_SITE_OTHER): Payer: Federal, State, Local not specified - PPO | Admitting: Cardiovascular Disease

## 2013-12-19 ENCOUNTER — Encounter: Payer: Self-pay | Admitting: Cardiovascular Disease

## 2013-12-19 VITALS — BP 115/67 | HR 60 | Ht 64.0 in | Wt 146.0 lb

## 2013-12-19 DIAGNOSIS — E785 Hyperlipidemia, unspecified: Secondary | ICD-10-CM

## 2013-12-19 DIAGNOSIS — R079 Chest pain, unspecified: Secondary | ICD-10-CM

## 2013-12-19 NOTE — Progress Notes (Signed)
History of Present Illness: 58 yo female with history of HLD, colon polyps, goiter s/p thyroidectomy, GERD here today as a new patient for evaluation of chest pain. She tells me that she has been having sharp left sided chest pains. This occurs when she is overwhelmed or stressed. These last for a few seconds and do not occur with exertion. No associated SOB, N/V. She also has rare palpitations. No exertional symptoms.   Primary Care Physician: Schoenhoff  Last Lipid Profile:Lipid Panel     Component Value Date/Time   CHOL 235* 06/07/2013 0001   TRIG 52 06/07/2013 0001   HDL 70 06/07/2013 0001   CHOLHDL 3.4 06/07/2013 0001   VLDL 10 06/07/2013 0001   LDLCALC 155* 06/07/2013 0001     Past Medical History  Diagnosis Date  . Goiter   . Hyperlipidemia   . Osteopenia   . Liver hemangioma   . Leukopenia   . Vitamin D deficiency   . Thrombocytopenia   . Colon polyps     Past Surgical History  Procedure Laterality Date  . Thyroidectomy, partial  04/2009  . Mouth surgery      Current Outpatient Prescriptions  Medication Sig Dispense Refill  . Calcium Carbonate-Vitamin D (CALCIUM + D PO) Take 1 tablet by mouth daily.      . ciprofloxacin (CILOXAN) 0.3 % ophthalmic solution 2 gtts Left eye qid  5 mL  0  . levothyroxine (SYNTHROID, LEVOTHROID) 50 MCG tablet Take one and 1/2 tablet on M,W,Fri and one tablet on T,Th,Sat, Sun  100 tablet  2  . LORazepam (ATIVAN) 1 MG tablet Take 1/2 tablet as needed every 12 hours.  May repeat in 30 mins if needed  20 tablet  1  . pantoprazole (PROTONIX) 20 MG tablet Take 1 tablet (20 mg total) by mouth daily.  30 tablet  5  . simvastatin (ZOCOR) 10 MG tablet TAKE 1 TABLET BY MOUTH EVERY NIGHT AT BEDTIME  90 tablet  0   No current facility-administered medications for this visit.    Allergies  Allergen Reactions  . Codeine Nausea Only  . Lactose Intolerance (Gi)     History   Social History  . Marital Status: Married    Spouse Name: N/A   Number of Children: 2  . Years of Education: N/A   Occupational History  . Works for [employer]    Social History Main Topics  . Smoking status: Never Smoker   . Smokeless tobacco: Never Used  . Alcohol Use: No  . Drug Use: No  . Sexual Activity: Yes    Birth Control/ Protection: Post-menopausal   Other Topics Concern  . Not on file   Social History Narrative  . No narrative on file    Family History  Problem Relation Age of Onset  . Cervical cancer Mother   . Diabetes Father   . Cervical cancer Sister   . Cancer Brother     brain  . Diabetes Brother   . Arthritis Sister     rheumatoid    Review of Systems:  As stated in the HPI and otherwise negative.   BP 115/67  Pulse 60  Ht 5\' 4"  (1.626 m)  Wt 146 lb (66.225 kg)  BMI 25.05 kg/m2  Physical Examination: General: Well developed, well nourished, NAD HEENT: OP clear, mucus membranes moist SKIN: warm, dry. No rashes. Neuro: No focal deficits Musculoskeletal: Muscle strength 5/5 all ext Psychiatric: Mood and affect normal Neck: No  JVD, no carotid bruits, no thyromegaly, no lymphadenopathy. Lungs:Clear bilaterally, no wheezes, rhonci, crackles Cardiovascular: Regular rate and rhythm. No murmurs, gallops or rubs. Abdomen:Soft. Bowel sounds present. Non-tender.  Extremities: No lower extremity edema. Pulses are 2 + in the bilateral DP/PT.  EKG: NSR, rate 60 bpm. Poor R wave progression precordial leads.   Assessment and Plan:   1. Chest pain: Her chest pains are atypical. Her risk factors for CAD include hyperlipidemia. I will arrange an echo to exclude structural heart disease, especially given her palpitations. Will arrange exercise treadmill stress test to assess exercise tolerance and exclude ischemia.   2. Palpitations: Rare, never last more than a few seconds. Will assess LVEF. Most likely premature beats and not often enough to catch on a monitor. If LV function is normal, would not work up  further. Avoid stimulants such as caffeine.

## 2013-12-19 NOTE — Patient Instructions (Addendum)
Your physician recommends that you schedule a follow-up appointment in:  4-6 weeks.   Your physician has requested that you have an echocardiogram. Echocardiography is a painless test that uses sound waves to create images of your heart. It provides your doctor with information about the size and shape of your heart and how well your heart's chambers and valves are working. This procedure takes approximately one hour. There are no restrictions for this procedure.   Your physician has requested that you have an exercise tolerance test. Can be done with NP or PA.  For further information please visit [email or web address]. Please also follow instruction sheet, as given.

## 2014-01-02 ENCOUNTER — Ambulatory Visit (HOSPITAL_COMMUNITY)
Admission: RE | Admit: 2014-01-02 | Discharge: 2014-01-02 | Disposition: A | Discharge status: Home / Self Care | Payer: Federal, State, Local not specified - PPO | Source: Ambulatory Visit | Attending: Cardiovascular Disease | Admitting: Cardiovascular Disease

## 2014-01-02 DIAGNOSIS — R079 Chest pain, unspecified: Secondary | ICD-10-CM

## 2014-01-07 ENCOUNTER — Telehealth: Payer: Self-pay | Admitting: *Deleted

## 2014-01-07 ENCOUNTER — Other Ambulatory Visit (HOSPITAL_COMMUNITY): Payer: Federal, State, Local not specified - PPO

## 2014-01-07 DIAGNOSIS — R079 Chest pain, unspecified: Secondary | ICD-10-CM

## 2014-01-07 NOTE — Telephone Encounter (Signed)
Note below copied from message from Dr. Angelena Form. I spoke with pt and reviewed information from Dr. Angelena Form with her. She would like to proceed with exercise myoview. Will have schedulers contact her to arrange appt.  I verbally went over instructions with pt for exercise myoview.    Voncille Lo, MD     Sent: Mon January 07, 2014 11:03 AM      To: Thompson Grayer, RN          Message      Has EKG changes on stress test but cannot say this is ischemia. We need to arrange a exercise stress myoview. Thanks, chris

## 2014-01-17 ENCOUNTER — Ambulatory Visit (HOSPITAL_COMMUNITY): Payer: Federal, State, Local not specified - PPO | Attending: Cardiovascular Disease | Admitting: Radiology

## 2014-01-17 DIAGNOSIS — R072 Precordial pain: Secondary | ICD-10-CM

## 2014-01-17 DIAGNOSIS — R079 Chest pain, unspecified: Secondary | ICD-10-CM | POA: Insufficient documentation

## 2014-01-17 NOTE — Progress Notes (Signed)
Echocardiogram performed.  

## 2014-01-23 ENCOUNTER — Telehealth: Payer: Self-pay | Admitting: Cardiovascular Disease

## 2014-01-23 NOTE — Telephone Encounter (Signed)
Spoke with pt and reviewed echo results with her.

## 2014-01-23 NOTE — Telephone Encounter (Signed)
New message    Patient calling stating nurse Fraser Din called her today returning call back .

## 2014-01-31 ENCOUNTER — Ambulatory Visit (HOSPITAL_COMMUNITY): Payer: Federal, State, Local not specified - PPO | Attending: Cardiology | Admitting: Radiology

## 2014-01-31 VITALS — BP 119/86 | HR 58 | Ht 64.0 in | Wt 143.0 lb

## 2014-01-31 DIAGNOSIS — R079 Chest pain, unspecified: Secondary | ICD-10-CM | POA: Insufficient documentation

## 2014-01-31 DIAGNOSIS — I4510 Unspecified right bundle-branch block: Secondary | ICD-10-CM | POA: Insufficient documentation

## 2014-01-31 DIAGNOSIS — R42 Dizziness and giddiness: Secondary | ICD-10-CM | POA: Insufficient documentation

## 2014-01-31 DIAGNOSIS — E785 Hyperlipidemia, unspecified: Secondary | ICD-10-CM | POA: Insufficient documentation

## 2014-01-31 DIAGNOSIS — R9439 Abnormal result of other cardiovascular function study: Secondary | ICD-10-CM | POA: Insufficient documentation

## 2014-01-31 DIAGNOSIS — R002 Palpitations: Secondary | ICD-10-CM | POA: Insufficient documentation

## 2014-01-31 MED ORDER — TECHNETIUM TC 99M SESTAMIBI GENERIC - CARDIOLITE
11.0000 | Freq: Once | INTRAVENOUS | Status: AC | PRN
  Administered 2014-01-31: 11 via INTRAVENOUS

## 2014-01-31 MED ORDER — TECHNETIUM TC 99M SESTAMIBI GENERIC - CARDIOLITE
33.0000 | Freq: Once | INTRAVENOUS | Status: AC | PRN
  Administered 2014-01-31: 33 via INTRAVENOUS

## 2014-01-31 NOTE — Progress Notes (Signed)
[name] [address] [name], [place] [address] [phone]    Cardiology Nuclear Med Study  [name] is a 58 y.o. female     MRN : [id number]     DOB: Aug 15, 1956  Procedure Date: 01/31/2014  Nuclear Med Background Indication for Stress Test:  Evaluation for Ischemia, and 01-02-2014 Abnormal ETT: ST changes (northline) History:  No known CAD, Echo 2015 (normal EF) Cardiac Risk Factors: Lipids, IRBBB Symptoms:  Chest Pain, Dizziness and Palpitations   Nuclear Pre-Procedure Caffeine/Decaff Intake:  None > 12 hrs NPO After: 8:00pm   Lungs:  clear O2 Sat: 97% on room air. IV 0.9% NS with Angio Cath:  22g  IV Site: R Antecubital x  1, tolerated well IV Started by:  Irven Baltimore, RN  Chest Size (in):  34 Cup Size: C  Height: 5\' 4"  (1.626 m)  Weight:  143 lb (64.864 kg)  BMI:  Body mass index is 24.53 kg/(m^2). Tech Comments:  N/A    Nuclear Med Study 1 or 2 day study: 1 day  Stress Test Type:  Stress  Reading MD: N/A  Order Authorizing Provider:  Lauree Chandler, MD  Resting Radionuclide: Technetium 95m Sestamibi  Resting Radionuclide Dose: 11.0 mCi   Stress Radionuclide:  Technetium 59m Sestamibi  Stress Radionuclide Dose: 33.0 mCi           Stress Protocol Rest HR: 58 Stress HR: 153  Rest BP: 119/86 Stress BP: 154/84  Exercise Time (min): 7:00 METS: 8.5           Dose of Adenosine (mg):  n/a Dose of Lexiscan: n/a mg  Dose of Atropine (mg): n/a Dose of Dobutamine: n/a mcg/kg/min (at max HR)  Stress Test Technologist: Glade Lloyd, BS-ES  Nuclear Technologist:  Charlton Amor, CNMT     Rest Procedure:  Myocardial perfusion imaging was performed at rest 45 minutes following the intravenous administration of Technetium 67m Sestamibi. Rest ECG: NSR - Normal EKG  Stress Procedure:  The patient exercised on the treadmill utilizing the Bruce Protocol for 7:00 minutes. The patient stopped due to fatigue and denied any chest  pain.  Technetium 77m Sestamibi was injected at peak exercise and myocardial perfusion imaging was performed after a brief delay. Stress ECG: No significant ST segment change suggestive of ischemia.  QPS Raw Data Images:  Normal; no motion artifact; normal heart/lung ratio. Stress Images:  Normal homogeneous uptake in all areas of the myocardium. Rest Images:  Normal homogeneous uptake in all areas of the myocardium. Subtraction (SDS):  No evidence of ischemia. Transient Ischemic Dilatation (Normal <1.22):  0.93 Lung/Heart Ratio (Normal <0.45):  0.25  Quantitative Gated Spect Images QGS EDV:  56 ml QGS ESV:  25 ml  Impression Exercise Capacity:  Fair exercise capacity. BP Response:  Normal blood pressure response. Clinical Symptoms:  No chest pain. ECG Impression:  No significant ST segment change suggestive of ischemia. Comparison with Prior Nuclear Study: No images to compare  Overall Impression:  Normal stress nuclear study.  LV Ejection Fraction: 56%.  LV Wall Motion:  NL LV Function; NL Wall Motion  [name]  MD

## 2014-02-04 ENCOUNTER — Encounter: Payer: Self-pay | Admitting: Cardiovascular Disease

## 2014-02-04 ENCOUNTER — Ambulatory Visit (INDEPENDENT_AMBULATORY_CARE_PROVIDER_SITE_OTHER): Payer: Federal, State, Local not specified - PPO | Admitting: Cardiovascular Disease

## 2014-02-04 VITALS — BP 112/80 | HR 72 | Ht 64.0 in | Wt 146.0 lb

## 2014-02-04 DIAGNOSIS — R079 Chest pain, unspecified: Secondary | ICD-10-CM

## 2014-02-04 DIAGNOSIS — R002 Palpitations: Secondary | ICD-10-CM

## 2014-02-04 NOTE — Progress Notes (Signed)
History of Present Illness: 58 yo female with history of HLD, colon polyps, goiter s/p thyroidectomy, GERD here today for cardiac follow up. I saw her as a new patient 12/19/13 for evaluation of chest pain. She told me that she had been having sharp left sided chest pains. This occurs when she is overwhelmed or stressed. These last for a few seconds and do not occur with exertion. No associated SOB, N/V. She also has rare palpitations. No exertional symptoms. I arranged an echo and exercise stress test. Echo 01/17/14 with normal LV size and function, no valve issues. Exercise stress test with 4 mm ST depression at peak exercise. We arranged an exercise stress myoview on 01/31/14 and she exercised for 7 minutes with no chest pain or EKG changes c/w ischemia. Her stress images showed no ischemia.   She is here today for follow up. She is feeling well. NO chest pain or SOB.   Primary Care Physician: Schoenhoff  Last Lipid Profile:Lipid Panel     Component Value Date/Time   CHOL 235* 06/07/2013 0001   TRIG 52 06/07/2013 0001   HDL 70 06/07/2013 0001   CHOLHDL 3.4 06/07/2013 0001   VLDL 10 06/07/2013 0001   LDLCALC 155* 06/07/2013 0001     Past Medical History  Diagnosis Date  . Goiter   . Hyperlipidemia   . Osteopenia   . Liver hemangioma   . Leukopenia   . Vitamin D deficiency   . Thrombocytopenia   . Colon polyps     Past Surgical History  Procedure Laterality Date  . Thyroidectomy, partial  04/2009  . Mouth surgery      Current Outpatient Prescriptions  Medication Sig Dispense Refill  . Calcium Carbonate-Vitamin D (CALCIUM + D PO) Take 1 tablet by mouth daily.      . ciprofloxacin (CILOXAN) 0.3 % ophthalmic solution 2 gtts Left eye qid  5 mL  0  . levothyroxine (SYNTHROID, LEVOTHROID) 50 MCG tablet Take one and 1/2 tablet on M,W,Fri and one tablet on T,Th,Sat, Sun  100 tablet  2  . LORazepam (ATIVAN) 1 MG tablet Take 1/2 tablet as needed every 12 hours.  May repeat in 30 mins if  needed  20 tablet  1  . pantoprazole (PROTONIX) 20 MG tablet Take 1 tablet (20 mg total) by mouth daily.  30 tablet  5  . simvastatin (ZOCOR) 10 MG tablet TAKE 1 TABLET BY MOUTH EVERY NIGHT AT BEDTIME  90 tablet  0   No current facility-administered medications for this visit.    Allergies  Allergen Reactions  . Codeine Nausea Only  . Lactose Intolerance (Gi)     History   Social History  . Marital Status: Married    Spouse Name: N/A    Number of Children: 2  . Years of Education: N/A   Occupational History  . Works for [employer]    Social History Main Topics  . Smoking status: Never Smoker   . Smokeless tobacco: Never Used  . Alcohol Use: No  . Drug Use: No  . Sexual Activity: Yes    Birth Control/ Protection: Post-menopausal   Other Topics Concern  . Not on file   Social History Narrative  . No narrative on file    Family History  Problem Relation Age of Onset  . Cervical cancer Mother   . Diabetes Father   . Cervical cancer Sister   . Cancer Brother     brain  .  Diabetes Brother   . Arthritis Sister     rheumatoid    Review of Systems:  As stated in the HPI and otherwise negative.   BP 112/80  Pulse 72  Ht 5\' 4"  (1.626 m)  Wt 146 lb (66.225 kg)  BMI 25.05 kg/m2  Physical Examination: General: Well developed, well nourished, NAD HEENT: OP clear, mucus membranes moist SKIN: warm, dry. No rashes. Neuro: No focal deficits Musculoskeletal: Muscle strength 5/5 all ext Psychiatric: Mood and affect normal Neck: No JVD, no carotid bruits, no thyromegaly, no lymphadenopathy. Lungs:Clear bilaterally, no wheezes, rhonci, crackles Cardiovascular: Regular rate and rhythm. No murmurs, gallops or rubs. Abdomen:Soft. Bowel sounds present. Non-tender.  Extremities: No lower extremity edema. Pulses are 2 + in the bilateral DP/PT.  Echo 01/17/14: Left ventricle: The cavity size was normal. Systolic function was normal. Wall motion was normal; there  were no regional wall motion abnormalities.  Stress myoview 01/31/14: Stress Procedure: The patient exercised on the treadmill utilizing the Bruce Protocol for 7:00 minutes. The patient stopped due to fatigue and denied any chest pain. Technetium 4m Sestamibi was injected at peak exercise and myocardial perfusion imaging was performed after a brief delay.  Stress ECG: No significant ST segment change suggestive of ischemia.  QPS  Raw Data Images: Normal; no motion artifact; normal heart/lung ratio.  Stress Images: Normal homogeneous uptake in all areas of the myocardium.  Rest Images: Normal homogeneous uptake in all areas of the myocardium.  Subtraction (SDS): No evidence of ischemia.  Transient Ischemic Dilatation (Normal <1.22): 0.93  Lung/Heart Ratio (Normal <0.45): 0.25  Quantitative Gated Spect Images  QGS EDV: 56 ml  QGS ESV: 25 ml  Impression  Exercise Capacity: Fair exercise capacity.  BP Response: Normal blood pressure response.  Clinical Symptoms: No chest pain.  ECG Impression: No significant ST segment change suggestive of ischemia.  Comparison with Prior Nuclear Study: No images to compare  Overall Impression: Normal stress nuclear study.  LV Ejection Fraction: 56%. LV Wall Motion: NL LV Function; NL Wall Motion  Assessment and Plan:   1. Chest pain: Her chest pains are atypical. Stress myoview without ischemia. Echo overall normal. No further cardiac workup. I suspect that her chest pain is stress related. She is also known to have GERD. Continue PPI.   2. Palpitations: Rare, never last more than a few seconds. LVEF is normal. Most likely premature beats and not often enough to catch on a monitor.  Avoid stimulants such as caffeine.

## 2014-02-04 NOTE — Patient Instructions (Signed)
Your physician recommends that you schedule a follow-up appointment as needed with Dr. McAlhany   

## 2014-02-05 ENCOUNTER — Other Ambulatory Visit: Payer: Self-pay | Admitting: Internal Medicine

## 2014-02-05 NOTE — Telephone Encounter (Signed)
Refill request

## 2014-05-06 ENCOUNTER — Other Ambulatory Visit: Payer: Self-pay | Admitting: Internal Medicine

## 2014-05-06 NOTE — Telephone Encounter (Signed)
Requested Medications     Medication name:  Name from pharmacy:  levothyroxine (SYNTHROID, LEVOTHROID) 50 MCG tablet  LEVOTHYROXINE 0.05MG  (50MCG) TAB    Sig: TAKE 1 1/2 TABLETS ON MONDAY, WEDNESDAY, AND FRIDAY. TAKE 1 TABLET ON TUESDAY, THURSDAY, SATURDAY, AND SUNDAY.    Dispense: 100 tablet Refills: 0 Start: 05/06/2014  Class: Normal    Requested on: 05/06/2014    Originally ordered on: 06/28/2011 Last refill: 02/06/2014 Order History and Details

## 2014-05-21 ENCOUNTER — Encounter: Payer: Self-pay | Admitting: *Deleted

## 2014-06-07 ENCOUNTER — Other Ambulatory Visit: Payer: Self-pay | Admitting: Internal Medicine

## 2014-06-10 NOTE — Telephone Encounter (Signed)
Requested Medications     Medication name:  Name from pharmacy:  simvastatin (ZOCOR) 10 MG tablet  SIMVASTATIN 10MG  TABLETS    Sig: TAKE 1 TABLET BY MOUTH EVERY NIGHT AT BEDTIME    Dispense: 90 tablet Refills: 0 Start: 06/07/2014  Class: Normal    Requested on: 06/07/2014    Originally ordered on: 06/28/2011 Last refill: 02/06/2014 Order History and Details

## 2014-07-15 ENCOUNTER — Other Ambulatory Visit: Payer: Self-pay | Admitting: *Deleted

## 2014-07-15 DIAGNOSIS — E785 Hyperlipidemia, unspecified: Secondary | ICD-10-CM

## 2014-07-15 DIAGNOSIS — E038 Other specified hypothyroidism: Secondary | ICD-10-CM

## 2014-07-15 DIAGNOSIS — Z0000 Encounter for general adult medical examination without abnormal findings: Secondary | ICD-10-CM

## 2014-07-19 LAB — COMPLETE METABOLIC PANEL WITH GFR
ALK PHOS: 73 U/L (ref 39–117)
ALT: 26 U/L (ref 0–35)
AST: 24 U/L (ref 0–37)
Albumin: 4.4 g/dL (ref 3.5–5.2)
BUN: 13 mg/dL (ref 6–23)
CO2: 29 mEq/L (ref 19–32)
CREATININE: 0.91 mg/dL (ref 0.50–1.10)
Calcium: 9 mg/dL (ref 8.4–10.5)
Chloride: 102 mEq/L (ref 96–112)
GFR, Est African American: 81 mL/min
GFR, Est Non African American: 70 mL/min
Glucose, Bld: 83 mg/dL (ref 70–99)
POTASSIUM: 4.2 meq/L (ref 3.5–5.3)
Sodium: 141 mEq/L (ref 135–145)
Total Bilirubin: 0.6 mg/dL (ref 0.2–1.2)
Total Protein: 7 g/dL (ref 6.0–8.3)

## 2014-07-19 LAB — LIPID PANEL
CHOL/HDL RATIO: 3.6 ratio
Cholesterol: 221 mg/dL — ABNORMAL HIGH (ref 0–200)
HDL: 62 mg/dL (ref >39)
LDL Cholesterol: 142 mg/dL — ABNORMAL HIGH (ref 0–99)
TRIGLYCERIDES: 84 mg/dL (ref <150)
VLDL: 17 mg/dL (ref 0–40)

## 2014-07-19 LAB — CBC WITH DIFFERENTIAL/PLATELET
BASOS ABS: 0 10*3/uL (ref 0.0–0.1)
BASOS PCT: 0 % (ref 0–1)
EOS PCT: 1 % (ref 0–5)
Eosinophils Absolute: 0 10*3/uL (ref 0.0–0.7)
HEMATOCRIT: 39 % (ref 36.0–46.0)
Hemoglobin: 13.3 g/dL (ref 12.0–15.0)
Lymphocytes Relative: 46 % (ref 12–46)
Lymphs Abs: 1.2 10*3/uL (ref 0.7–4.0)
MCH: 30.5 pg (ref 26.0–34.0)
MCHC: 34.1 g/dL (ref 30.0–36.0)
MCV: 89.4 fL (ref 78.0–100.0)
MONO ABS: 0.3 10*3/uL (ref 0.1–1.0)
Monocytes Relative: 10 % (ref 3–12)
NEUTROS ABS: 1.1 10*3/uL — AB (ref 1.7–7.7)
Neutrophils Relative %: 43 % (ref 43–77)
PLATELETS: 161 10*3/uL (ref 150–400)
RBC: 4.36 MIL/uL (ref 3.87–5.11)
RDW: 12.5 % (ref 11.5–15.5)
WBC: 2.6 10*3/uL — ABNORMAL LOW (ref 4.0–10.5)

## 2014-07-19 LAB — TSH: TSH: 1.872 u[IU]/mL (ref 0.350–4.500)

## 2014-07-20 LAB — VITAMIN D 25 HYDROXY (VIT D DEFICIENCY, FRACTURES): Vit D, 25-Hydroxy: 24 ng/mL — ABNORMAL LOW (ref 30–89)

## 2014-07-21 ENCOUNTER — Encounter: Payer: Self-pay | Admitting: Internal Medicine

## 2014-07-30 NOTE — Progress Notes (Signed)
Subjective:    Patient ID: [name], female    DOB: [birth year], 58 y.o.   MRN: [id number]  HPI 4/20 cardiology note Assessment and Plan:  1. Chest pain: Her chest pains are atypical. Stress myoview without ischemia. Echo overall normal. No further cardiac workup. I suspect that her chest pain is stress related. She is also known to have GERD. Continue PPI.  2. Palpitations: Rare, never last more than a few seconds. LVEF is normal. Most likely premature beats and not often enough to catch on a monitor. Avoid stimulants such as caffeine.   2/18/ my note Conjunctivitis Ciloxan 2 gtts qid. 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 Pt would like to discuss with cardiologist [facility] to refer. ADvised to avoid caffiene , ephedrin  GERD Will change to Protonix 20 mg stop prilosec  Today    No further chest pain   See CBC  Samiyyah has been seen by hematologist Dr. Harlow Asa at [facility] for this  She has facial bump that has been there for 8 weeks    Pt does have a dermatologist.   Allergies  Allergen Reactions  . Codeine Nausea Only  . Lactose Intolerance (Gi)    Past Medical History  Diagnosis Date  . Goiter   . Hyperlipidemia   . Osteopenia   . Liver hemangioma   . Leukopenia   . Vitamin D deficiency   . Thrombocytopenia   . Colon polyps    Past Surgical History  Procedure Laterality Date  . Thyroidectomy, partial  04/2009  . Mouth surgery     History   Social History  . Marital Status: Married    Spouse Name: N/A    Number of Children: 2  . Years of Education: N/A   Occupational History  . Works for [employer]    Social History Main Topics  . Smoking status: Never Smoker   . Smokeless tobacco: Never Used  . Alcohol Use: No  . Drug Use: No  . Sexual Activity: Yes    Birth Control/ Protection: Post-menopausal   Other Topics Concern  . Not on file   Social History Narrative  . No narrative on file   Family History    Problem Relation Age of Onset  . Cervical cancer Mother   . Diabetes Father   . Cervical cancer Sister   . Cancer Brother     brain  . Diabetes Brother   . Arthritis Sister     rheumatoid   Patient Active Problem List   Diagnosis Date Noted  . 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 GERD (gastroesophageal reflux disease) 12/05/2013  . Colon polyps 09/05/2013  . Adjustment disorder with mixed anxiety and depressed mood 01/09/2013  . Family history of cervical cancer 04/12/2012  . Unspecified hypothyroidism 03/21/2012  . Goiter 03/21/2012  . Other and unspecified hyperlipidemia 03/21/2012  . Menopause 03/21/2012  . Positive ANA (antinuclear antibody) 03/21/2012  . Leukopenia 03/21/2012  . Thrombocytopenia 03/21/2012  . Hepatic lesion 03/21/2012  . Vitamin d deficiency 03/21/2012  . Hx of adenomatous colonic polyps 03/21/2012  . Osteopenia 04/26/2011   Current Outpatient Prescriptions on File Prior to Visit  Medication Sig Dispense Refill  . Calcium Carbonate-Vitamin D (CALCIUM + D PO) Take 1 tablet by mouth daily.      . ciprofloxacin (CILOXAN) 0.3 % ophthalmic solution 2 gtts Left eye qid  5 mL  0  . levothyroxine (SYNTHROID,  LEVOTHROID) 50 MCG tablet Take one and 1/2 tablet on M,W,Fri and one tablet on T,Th,Sat, Sun  100 tablet  2  . levothyroxine (SYNTHROID, LEVOTHROID) 50 MCG tablet TAKE 1 1/2 TABLETS ON MONDAY, WEDNESDAY, AND FRIDAY. TAKE 1 TABLET ON TUESDAY, THURSDAY, SATURDAY, AND SUNDAY.  100 tablet  0  . LORazepam (ATIVAN) 1 MG tablet Take 1/2 tablet as needed every 12 hours.  May repeat in 30 mins if needed  20 tablet  1  . pantoprazole (PROTONIX) 20 MG tablet Take 1 tablet (20 mg total) by mouth daily.  30 tablet  5  . simvastatin (ZOCOR) 10 MG tablet TAKE 1 TABLET BY MOUTH EVERY NIGHT AT BEDTIME  90 tablet  1   No current facility-administered medications on file prior to visit.      Review of Systems    see HPI Objective:   Physical  Exam  Physical Exam  Nursing note and vitals reviewed.  Constitutional: She is oriented to person, place, and time. She appears well-developed and well-nourished.  HENT:  Head: Normocephalic and atraumatic.  Cardiovascular: Normal rate and regular rhythm. Exam reveals no gallop and no friction rub.  No murmur heard.  Pulmonary/Chest: Breath sounds normal. She has no wheezes. She has no rales.  Neurological: She is alert and oriented to person, place, and time.  Skin: Skin is warm and dry.  smal facial nodule left cheek   Psychiatric: She has a normal mood and affect. Her behavior is normal.            Assessment & Plan:  Low WBC  Will recheck today  She hs seen dermatolgist Dr. Harlow Asa for this  Atypical chest pain  With short PR syndrome  Work u pneg  Facial lesion advised pt to make appt with her dermatologist   Schedule CPE

## 2014-07-31 ENCOUNTER — Ambulatory Visit (INDEPENDENT_AMBULATORY_CARE_PROVIDER_SITE_OTHER): Payer: Federal, State, Local not specified - PPO | Admitting: Internal Medicine

## 2014-07-31 ENCOUNTER — Ambulatory Visit: Payer: Federal, State, Local not specified - PPO | Admitting: Internal Medicine

## 2014-07-31 ENCOUNTER — Encounter: Payer: Self-pay | Admitting: Internal Medicine

## 2014-07-31 VITALS — BP 111/73 | HR 64 | Temp 98.1°F | Resp 16 | Ht 64.0 in | Wt 144.0 lb

## 2014-07-31 DIAGNOSIS — D709 Neutropenia, unspecified: Secondary | ICD-10-CM

## 2014-07-31 DIAGNOSIS — I456 Pre-excitation syndrome: Secondary | ICD-10-CM | POA: Diagnosis not present

## 2014-07-31 DIAGNOSIS — Z23 Encounter for immunization: Secondary | ICD-10-CM

## 2014-07-31 LAB — CBC WITH DIFFERENTIAL/PLATELET
BASOS PCT: 1 % (ref 0–1)
Basophils Absolute: 0 10*3/uL (ref 0.0–0.1)
EOS PCT: 2 % (ref 0–5)
Eosinophils Absolute: 0.1 10*3/uL (ref 0.0–0.7)
HEMATOCRIT: 40.1 % (ref 36.0–46.0)
Hemoglobin: 13.3 g/dL (ref 12.0–15.0)
Lymphocytes Relative: 46 % (ref 12–46)
Lymphs Abs: 1.5 10*3/uL (ref 0.7–4.0)
MCH: 29.9 pg (ref 26.0–34.0)
MCHC: 33.2 g/dL (ref 30.0–36.0)
MCV: 90.1 fL (ref 78.0–100.0)
MONO ABS: 0.3 10*3/uL (ref 0.1–1.0)
Monocytes Relative: 8 % (ref 3–12)
Neutro Abs: 1.4 10*3/uL — ABNORMAL LOW (ref 1.7–7.7)
Neutrophils Relative %: 43 % (ref 43–77)
Platelets: 179 10*3/uL (ref 150–400)
RBC: 4.45 MIL/uL (ref 3.87–5.11)
RDW: 12.3 % (ref 11.5–15.5)
WBC: 3.3 10*3/uL — ABNORMAL LOW (ref 4.0–10.5)

## 2014-07-31 NOTE — Patient Instructions (Signed)
Schedule CPE  To lab today

## 2014-08-01 ENCOUNTER — Telehealth: Payer: Self-pay | Admitting: *Deleted

## 2014-08-01 ENCOUNTER — Encounter: Payer: Self-pay | Admitting: *Deleted

## 2014-08-01 NOTE — Telephone Encounter (Signed)
Called pt to inform about WBC and adv will mail results to pt. LMOM for her to rtn call if needed

## 2014-08-01 NOTE — Telephone Encounter (Signed)
Message copied by [name] on Thu Aug 01, 2014 12:24 PM ------      Message from: [name]      Created: Thu Aug 01, 2014 11:28 AM       Call pt and let her know that r WBC is slighlty improved.  Ok t mail to pt ------

## 2014-08-02 ENCOUNTER — Other Ambulatory Visit: Payer: Self-pay | Admitting: Internal Medicine

## 2014-08-02 NOTE — Telephone Encounter (Signed)
Refill request

## 2014-08-19 ENCOUNTER — Encounter: Payer: Self-pay | Admitting: Internal Medicine

## 2014-10-27 NOTE — Progress Notes (Signed)
Subjective:    Patient ID: [name], female    DOB: Mar 18, 1956, 59 y.o.   MRN: [id number]  HPI   07/2014 Low WBC Will recheck today She hs seen dermatolgist Dr. Harlow Asa for this  Atypical chest pain With short PR syndrome Work u pneg  Facial lesion advised pt to make appt with her dermatologist   Schedule CPE  TODAY:  [name] is here for acute visit.  She reports she felt a growth below left breast over the past 2 months.   She does not feel a breast mass.     Allergies  Allergen Reactions  . Codeine Nausea Only  . Lactose Intolerance (Gi)    Past Medical History  Diagnosis Date  . Goiter   . Hyperlipidemia   . Osteopenia   . Liver hemangioma   . Leukopenia   . Vitamin D deficiency   . Thrombocytopenia   . Colon polyps    Past Surgical History  Procedure Laterality Date  . Thyroidectomy, partial  04/2009  . Mouth surgery     History   Social History  . Marital Status: Married    Spouse Name: N/A    Number of Children: 2  . Years of Education: N/A   Occupational History  . Works for [employer]    Social History Main Topics  . Smoking status: Never Smoker   . Smokeless tobacco: Never Used  . Alcohol Use: No  . Drug Use: No  . Sexual Activity: Yes    Birth Control/ Protection: Post-menopausal   Other Topics Concern  . Not on file   Social History Narrative   Family History  Problem Relation Age of Onset  . Cervical cancer Mother   . Diabetes Father   . Cervical cancer Sister   . Cancer Brother     brain  . Diabetes Brother   . Arthritis Sister     rheumatoid   Patient Active Problem List   Diagnosis Date Noted  . 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 GERD (gastroesophageal reflux disease) 12/05/2013  . Colon polyps 09/05/2013  . Adjustment disorder with mixed anxiety and depressed mood 01/09/2013  . Family history of cervical cancer 04/12/2012  . Unspecified hypothyroidism 03/21/2012  . Goiter 03/21/2012  .  Other and unspecified hyperlipidemia 03/21/2012  . Menopause 03/21/2012  . Positive ANA (antinuclear antibody) 03/21/2012  . Leukopenia 03/21/2012  . Thrombocytopenia 03/21/2012  . Hepatic lesion 03/21/2012  . Vitamin d deficiency 03/21/2012  . Hx of adenomatous colonic polyps 03/21/2012  . Osteopenia 04/26/2011   Current Outpatient Prescriptions on File Prior to Visit  Medication Sig Dispense Refill  . Calcium Carbonate-Vitamin D (CALCIUM + D PO) Take 1 tablet by mouth daily.    Marland Kitchen levothyroxine (SYNTHROID, LEVOTHROID) 50 MCG tablet Take one and 1/2 tablet on M,W,Fri and one tablet on T,Th,Sat, Sun 100 tablet 2  . levothyroxine (SYNTHROID, LEVOTHROID) 50 MCG tablet TAKE 1 1/2 TABLETS ON MONDAY, WEDNESDAY, AND FRIDAY. TAKE 1 TABLET ON TUESDAY, THURSDAY, SATURDAY, AND SUNDAY. 100 tablet 3  . LORazepam (ATIVAN) 1 MG tablet Take 1/2 tablet as needed every 12 hours.  May repeat in 30 mins if needed 20 tablet 1  . simvastatin (ZOCOR) 10 MG tablet TAKE 1 TABLET BY MOUTH EVERY NIGHT AT BEDTIME 90 tablet 1   No current facility-administered medications on file prior to visit.       Review of Systems    see HPI Objective:   Physical  Exam  Physical Exam  Nursing note and vitals reviewed.  Constitutional: She is oriented to person, place, and time. She appears well-developed and well-nourished.  HENT:  Head: Normocephalic and atraumatic.  Cardiovascular: Normal rate and regular rhythm. Exam reveals no gallop and no friction rub.  No murmur heard.  Pulmonary/Chest: Breath sounds normal. She has no wheezes. She has no rales.  She has symmetrical  Easily mobile mass consistant with lipoma just below bra line .  Areas are symmetrical below both breasts Breasts  No discrete mass no nipple discharge no axillary adenopathy bilaterally  Neurological: She is alert and oriented to person, place, and time.  Skin: Skin is warm and dry.  Psychiatric: She has a normal mood and affect. Her behavior is  normal.         Assessment & Plan:  Probable lipoma  .   Pt does not wish any surgical intervention or imaging now

## 2014-10-28 ENCOUNTER — Encounter: Payer: Self-pay | Admitting: Internal Medicine

## 2014-10-28 ENCOUNTER — Ambulatory Visit (INDEPENDENT_AMBULATORY_CARE_PROVIDER_SITE_OTHER): Payer: Federal, State, Local not specified - PPO | Admitting: Internal Medicine

## 2014-10-28 VITALS — BP 123/74 | HR 66 | Resp 16 | Ht 63.0 in | Wt 149.0 lb

## 2014-10-28 DIAGNOSIS — D179 Benign lipomatous neoplasm, unspecified: Secondary | ICD-10-CM | POA: Diagnosis not present

## 2014-10-28 NOTE — Patient Instructions (Signed)
See me as needed 

## 2014-11-19 ENCOUNTER — Encounter: Payer: Self-pay | Admitting: Internal Medicine

## 2014-11-19 ENCOUNTER — Ambulatory Visit (INDEPENDENT_AMBULATORY_CARE_PROVIDER_SITE_OTHER): Payer: Federal, State, Local not specified - PPO | Admitting: Internal Medicine

## 2014-11-19 ENCOUNTER — Encounter: Payer: Self-pay | Admitting: *Deleted

## 2014-11-19 VITALS — BP 97/61 | HR 72 | Resp 16 | Ht 63.5 in | Wt 148.0 lb

## 2014-11-19 DIAGNOSIS — Z1151 Encounter for screening for human papillomavirus (HPV): Secondary | ICD-10-CM

## 2014-11-19 DIAGNOSIS — E038 Other specified hypothyroidism: Secondary | ICD-10-CM | POA: Diagnosis not present

## 2014-11-19 DIAGNOSIS — Z0000 Encounter for general adult medical examination without abnormal findings: Secondary | ICD-10-CM | POA: Diagnosis not present

## 2014-11-19 DIAGNOSIS — Z124 Encounter for screening for malignant neoplasm of cervix: Secondary | ICD-10-CM | POA: Diagnosis not present

## 2014-11-19 DIAGNOSIS — Z1211 Encounter for screening for malignant neoplasm of colon: Secondary | ICD-10-CM

## 2014-11-19 DIAGNOSIS — E785 Hyperlipidemia, unspecified: Secondary | ICD-10-CM | POA: Diagnosis not present

## 2014-11-19 LAB — POCT URINALYSIS DIPSTICK
BILIRUBIN UA: NEGATIVE
Blood, UA: NEGATIVE
GLUCOSE UA: NEGATIVE
Ketones, UA: NEGATIVE
LEUKOCYTES UA: NEGATIVE
Nitrite, UA: NEGATIVE
Protein, UA: NEGATIVE
Spec Grav, UA: 1.01
UROBILINOGEN UA: NEGATIVE
pH, UA: 6.5

## 2014-11-19 LAB — HEMOCCULT GUIAC POC 1CARD (OFFICE)
Fecal Occult Blood, POC: NEGATIVE
OCCULT BLOOD DATE: 2022016

## 2014-11-19 NOTE — Progress Notes (Signed)
Subjective:    Patient ID: [name], female    DOB: Dec 07, 1955, 58 y.o.   MRN: [id number]  HPI  07/2014 note Low WBC Will recheck today She hs seen dermatolgist Dr. Harlow Asa for this  Atypical chest pain With short PR syndrome Work u pneg  Facial lesion advised pt to make appt with her dermatologist   Schedule CPE  TODAY  [name] is here for CPE  HM:  Pap today  UTD with mm colonosocopy due 2017  She is a non-smoker   Hyperlipdemia  Osteopenia on ca vit d  Allergies  Allergen Reactions  . Codeine Nausea Only  . Lactose Intolerance (Gi)    Past Medical History  Diagnosis Date  . Goiter   . Hyperlipidemia   . Osteopenia   . Liver hemangioma   . Leukopenia   . Vitamin D deficiency   . Thrombocytopenia   . Colon polyps    Past Surgical History  Procedure Laterality Date  . Thyroidectomy, partial  04/2009  . Mouth surgery     History   Social History  . Marital Status: Married    Spouse Name: N/A    Number of Children: 2  . Years of Education: N/A   Occupational History  . Works for [employer]    Social History Main Topics  . Smoking status: Never Smoker   . Smokeless tobacco: Never Used  . Alcohol Use: No  . Drug Use: No  . Sexual Activity: Yes    Birth Control/ Protection: Post-menopausal   Other Topics Concern  . Not on file   Social History Narrative   Family History  Problem Relation Age of Onset  . Cervical cancer Mother   . Diabetes Father   . Cervical cancer Sister   . Cancer Brother     brain  . Diabetes Brother   . Arthritis Sister     rheumatoid   Patient Active Problem List   Diagnosis Date Noted  . 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 GERD (gastroesophageal reflux disease) 12/05/2013  . Colon polyps 09/05/2013  . Adjustment disorder with mixed anxiety and depressed mood 01/09/2013  . Family history of cervical cancer 04/12/2012  . Unspecified hypothyroidism 03/21/2012  . Goiter 03/21/2012  .  Other and unspecified hyperlipidemia 03/21/2012  . Menopause 03/21/2012  . Positive ANA (antinuclear antibody) 03/21/2012  . Leukopenia 03/21/2012  . Thrombocytopenia 03/21/2012  . Hepatic lesion 03/21/2012  . Vitamin d deficiency 03/21/2012  . Hx of adenomatous colonic polyps 03/21/2012  . Osteopenia 04/26/2011   Current Outpatient Prescriptions on File Prior to Visit  Medication Sig Dispense Refill  . Calcium Carbonate-Vitamin D (CALCIUM + D PO) Take 1 tablet by mouth daily.    Marland Kitchen levothyroxine (SYNTHROID, LEVOTHROID) 50 MCG tablet Take one and 1/2 tablet on M,W,Fri and one tablet on T,Th,Sat, Sun 100 tablet 2  . LORazepam (ATIVAN) 1 MG tablet Take 1/2 tablet as needed every 12 hours.  May repeat in 30 mins if needed 20 tablet 1  . simvastatin (ZOCOR) 10 MG tablet TAKE 1 TABLET BY MOUTH EVERY NIGHT AT BEDTIME 90 tablet 1   No current facility-administered medications on file prior to visit.      Review of Systems  Respiratory: Negative for cough, chest tightness, shortness of breath and wheezing.   Cardiovascular: Negative for chest pain, palpitations and leg swelling.  All other systems reviewed and are negative.      Objective:   Physical Exam .  Physical Exam  Vital signs and nursing note reviewed  Constitutional: She is oriented to person, place, and time. She appears well-developed and well-nourished. She is cooperative.  HENT:  Head: Normocephalic and atraumatic.  Right Ear: Tympanic membrane normal.  Left Ear: Tympanic membrane normal.  Nose: Nose normal.  Mouth/Throat: Oropharynx is clear and moist and mucous membranes are normal. No oropharyngeal exudate or posterior oropharyngeal erythema.  Eyes: Conjunctivae and EOM are normal. Pupils are equal, round, and reactive to light.  Neck: Neck supple. No JVD present. Carotid bruit is not present. No mass and no thyromegaly present.  Cardiovascular: Regular rhythm, normal heart sounds, intact distal pulses and normal  pulses.  Exam reveals no gallop and no friction rub.   No murmur heard. Pulses:      Dorsalis pedis pulses are 2+ on the right side, and 2+ on the left side.  Pulmonary/Chest: Breath sounds normal. She has no wheezes. She has no rhonchi. She has no rales. Right breast exhibits no mass, no nipple discharge and no skin change. Left breast exhibits no mass, no nipple discharge and no skin change.  Abdominal: Soft. Bowel sounds are normal. She exhibits no distension and no mass. There is no hepatosplenomegaly. There is no tenderness. There is no CVA tenderness.  rectal no mass guaiac neg Genitourinary: Rectum normal, vagina normal and uterus normal. Rectal exam shows no mass. Guaiac negative stool. No labial fusion. There is no lesion on the right labia. There is no lesion on the left labia. Cervix exhibits no motion tenderness. Right adnexum displays no mass, no tenderness and no fullness. Left adnexum displays no mass, no tenderness and no fullness. No erythema around the vagina.  Musculoskeletal:       No active synovitis to any joint.    Lymphadenopathy:       Right cervical: No superficial cervical adenopathy present.      Left cervical: No superficial cervical adenopathy present.       Right axillary: No pectoral and no lateral adenopathy present.       Left axillary: No pectoral and no lateral adenopathy present.      Right: No inguinal adenopathy present.       Left: No inguinal adenopathy present.  Neurological: She is alert and oriented to person, place, and time. She has normal strength and normal reflexes. No cranial nerve deficit or sensory deficit. She displays a negative Romberg sign. Coordination and gait normal.  Skin: Skin is warm and dry. No abrasion, no bruising, no ecchymosis and no rash noted. No cyanosis. Nails show no clubbing.  Psychiatric: She has a normal mood and affect. Her speech is normal and behavior is normal.          Assessment & Plan:  HM pap today  Pt is a  nonsmoker  UTD with mm  Advised colonoscopy in 2017  She declines vaccine today    hyperlipidemai continue Zocor  Hypothryoidism  Continue meds  Osteopenia  Ca vitamin d         Assessment & Plan:

## 2014-11-21 LAB — CYTOLOGY - PAP

## 2014-12-15 ENCOUNTER — Other Ambulatory Visit: Payer: Self-pay | Admitting: Internal Medicine

## 2014-12-16 NOTE — Telephone Encounter (Signed)
Refill request

## 2015-03-26 ENCOUNTER — Ambulatory Visit (INDEPENDENT_AMBULATORY_CARE_PROVIDER_SITE_OTHER): Payer: Federal, State, Local not specified - PPO | Admitting: Internal Medicine

## 2015-03-26 VITALS — BP 122/74 | HR 105 | Temp 98.4°F | Resp 17 | Ht 64.0 in | Wt 149.0 lb

## 2015-03-26 DIAGNOSIS — N390 Urinary tract infection, site not specified: Secondary | ICD-10-CM

## 2015-03-26 DIAGNOSIS — R300 Dysuria: Secondary | ICD-10-CM | POA: Diagnosis not present

## 2015-03-26 DIAGNOSIS — R6883 Chills (without fever): Secondary | ICD-10-CM

## 2015-03-26 LAB — POCT URINALYSIS DIPSTICK
BILIRUBIN UA: NEGATIVE
Glucose, UA: NEGATIVE
KETONES UA: NEGATIVE
Nitrite, UA: NEGATIVE
PH UA: 5.5
Protein, UA: NEGATIVE
Urobilinogen, UA: 0.2

## 2015-03-26 LAB — POCT UA - MICROSCOPIC ONLY
Casts, Ur, LPF, POC: NEGATIVE
Crystals, Ur, HPF, POC: NEGATIVE
Mucus, UA: NEGATIVE
Yeast, UA: NEGATIVE

## 2015-03-26 MED ORDER — CIPROFLOXACIN HCL 500 MG PO TABS: 500.0000 mg | ORAL_TABLET | Freq: Two times a day (BID) | ORAL | Status: DC

## 2015-03-26 MED ORDER — PHENAZOPYRIDINE HCL 200 MG PO TABS: 200.0000 mg | ORAL_TABLET | Freq: Three times a day (TID) | ORAL | Status: DC | PRN

## 2015-03-26 NOTE — Progress Notes (Signed)
   Subjective:    Patient ID: [name], female    DOB: 03-Oct-1956, 59 y.o.   MRN: [id number]  HPI This chart was scribed by Benetta Spar CMA for Dr. Elder Cyphers.  Patient is here today for a bladder infection. She states that she first felt lower back pain last night. She has a history of UTIs. She experienced chills 2 days ago.  Rarely has utis.   Review of Systems     Objective:   Physical Exam  Constitutional: She is oriented to person, place, and time. She appears well-developed and well-nourished.  HENT:  Head: Normocephalic.  Mouth/Throat: Oropharynx is clear and moist.  Eyes: EOM are normal. No scleral icterus.  Neck: Normal range of motion.  Pulmonary/Chest: Effort normal.  Abdominal: Soft. She exhibits no distension. There is tenderness in the suprapubic area. There is no rigidity and no CVA tenderness.  Neurological: She is alert and oriented to person, place, and time. Coordination normal.  Psychiatric: She has a normal mood and affect.     Results for orders placed or performed in visit on 03/26/15  POCT UA - Microscopic Only  Result Value Ref Range   WBC, Ur, HPF, POC 10-20    RBC, urine, microscopic 0-1    Bacteria, U Microscopic trace    Mucus, UA neg    Epithelial cells, urine per micros 0-4    Crystals, Ur, HPF, POC neg    Casts, Ur, LPF, POC neg    Yeast, UA neg   POCT urinalysis dipstick  Result Value Ref Range   Color, UA light yellow    Clarity, UA clear    Glucose, UA neg    Bilirubin, UA neg    Ketones, UA neg    Spec Grav, UA <=1.005    Blood, UA large    pH, UA 5.5    Protein, UA neg    Urobilinogen, UA 0.2    Nitrite, UA neg    Leukocytes, UA large (3+)    CS urine     Assessment & Plan:  UTI/Cystitis early Pyelonephritis Cipro/PYridium

## 2015-03-26 NOTE — Patient Instructions (Signed)

## 2015-03-28 LAB — URINE CULTURE: Colony Count: 25000

## 2015-05-15 ENCOUNTER — Encounter: Payer: Self-pay | Admitting: Emergency Medicine

## 2015-06-06 ENCOUNTER — Ambulatory Visit (INDEPENDENT_AMBULATORY_CARE_PROVIDER_SITE_OTHER): Payer: Federal, State, Local not specified - PPO | Admitting: Primary Care

## 2015-06-06 ENCOUNTER — Encounter: Payer: Self-pay | Admitting: Primary Care

## 2015-06-06 VITALS — BP 124/84 | HR 62 | Temp 97.5°F | Ht 64.0 in | Wt 150.8 lb

## 2015-06-06 DIAGNOSIS — E039 Hypothyroidism, unspecified: Secondary | ICD-10-CM | POA: Diagnosis not present

## 2015-06-06 DIAGNOSIS — E785 Hyperlipidemia, unspecified: Secondary | ICD-10-CM | POA: Diagnosis not present

## 2015-06-06 DIAGNOSIS — F4323 Adjustment disorder with mixed anxiety and depressed mood: Secondary | ICD-10-CM

## 2015-06-06 MED ORDER — SIMVASTATIN 10 MG PO TABS: 10.0000 mg | ORAL_TABLET | Freq: Every day | ORAL | Status: AC

## 2015-06-06 NOTE — Assessment & Plan Note (Signed)
Stable. Once with Goiter, thyroid removed, now maintained on levothyroxine 50 mcg. TSH due in October 2016. Will check then with physical.

## 2015-06-06 NOTE — Patient Instructions (Addendum)
Please schedule a physical with me in October 2016. You will also schedule a lab only appointment one week prior. We will discuss your lab results during your physical.  Refills for your simvastatin have been sent to your pharmacy.   It was a pleasure to meet you today! Please don't hesitate to call me with any questions. Welcome to [facility]!

## 2015-06-06 NOTE — Assessment & Plan Note (Signed)
[place] situational anxiety. Will take lorazepam once monthly on average, only for important work meetings or gatherings. No refills required today, when needed will obtain contract and UDS.

## 2015-06-06 NOTE — Assessment & Plan Note (Signed)
Managed on Simvastatin 10 mg. Lipid panel due in October 2016.  Occasional myalgias, mild

## 2015-06-06 NOTE — Progress Notes (Signed)
Subjective:    Patient ID: [name], female    DOB: [birth year], 59 y.o.   MRN: [id number]  HPI  [name] is a 59 year old female who presents today to establish care and discuss the problems mentioned below. Will obtain old records.  1) Hypothyroidism: Diagnosed years ago. Initially had goiter, thyroid removed and is managed on levothyroxine 50 mcg. She denies hair loss, fatigue, palpitations.  2) Hyperlipidemia: Diagnosed years ago and is managed on simvastatin 10 mg. Reports occasional myalgias.  3) Chest pain: Has had full work up before with stress myoview without ischemia, echocardiogram completed in April 2015. Denies recent chest pain.  4) Situational Anxiety: Denies feeling anxious/worrying daily, but does have high stress job and will take lorazepam once to twice monthly during important work meetings or gatherings. The lorazepam does provide relief. Uses very sparingly.  Review of Systems  HENT: Positive for postnasal drip. Negative for rhinorrhea.   Respiratory: Negative for shortness of breath.   Cardiovascular: Negative for chest pain.  Gastrointestinal: Negative for diarrhea and constipation.  Genitourinary: Negative for difficulty urinating.  Musculoskeletal: Negative for myalgias and arthralgias.  Skin: Negative for rash.  Neurological: Negative for dizziness.       Occasional numbness and tingling to feet and fingers after sitting too long  Psychiatric/Behavioral:       Denies concerns for depression       Past Medical History  Diagnosis Date  . Goiter   . Hyperlipidemia   . Osteopenia   . Liver hemangioma   . Leukopenia   . Vitamin D deficiency   . Thrombocytopenia   . Colon polyps   . GERD (gastroesophageal reflux disease)     Social History   Social History  . Marital Status: Married    Spouse Name: N/A  . Number of Children: 2  . Years of Education: N/A   Occupational History  . Works for [employer]    Social History Main  Topics  . Smoking status: Never Smoker   . Smokeless tobacco: Never Used  . Alcohol Use: No  . Drug Use: No  . Sexual Activity: Yes    Birth Control/ Protection: Post-menopausal   Other Topics Concern  . Not on file   Social History Narrative   Married.   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     Past Surgical History  Procedure Laterality Date  . Thyroidectomy, partial  04/2009  . Mouth surgery      Family History  Problem Relation Age of Onset  . Cervical cancer Mother   . Diabetes Father   . Cervical cancer Sister   . Cancer Brother     brain  . Diabetes Brother   . Arthritis Sister     rheumatoid    Allergies  Allergen Reactions  . Codeine Nausea Only  . Lactose Intolerance (Gi)     Current Outpatient Prescriptions on File Prior to Visit  Medication Sig Dispense Refill  . Calcium Carbonate-Vitamin D (CALCIUM + D PO) Take 1 tablet by mouth daily.    Marland Kitchen levothyroxine (SYNTHROID, LEVOTHROID) 50 MCG tablet Take one and 1/2 tablet on M,W,Fri and one tablet on T,Th,Sat, Sun 100 tablet 2  . LORazepam (ATIVAN) 1 MG tablet Take 1/2 tablet as needed every 12 hours.  May repeat in 30 mins if needed 20 tablet 1  . phenazopyridine (PYRIDIUM) 200 MG tablet Take 1 tablet (200 mg  total) by mouth 3 (three) times daily as needed for pain. 10 tablet 0   No current facility-administered medications on file prior to visit.    BP 124/84 mmHg  Pulse 62  Temp(Src) 97.5 F (36.4 C) (Oral)  Ht 5\' 4"  (1.626 m)  Wt 150 lb 12.8 oz (68.402 kg)  BMI 25.87 kg/m2  SpO2 98%    Objective:   Physical Exam  Constitutional: She appears well-nourished.  Neck: Neck supple.  Cardiovascular: Normal rate and regular rhythm.   Pulmonary/Chest: Effort normal and breath sounds normal.  Skin: Skin is warm and dry.  Psychiatric: She has a normal mood and affect.          Assessment & Plan:

## 2015-06-06 NOTE — Progress Notes (Signed)
Pre visit review using our clinic review tool, if applicable. No additional management support is needed unless otherwise documented below in the visit note. 

## 2015-07-04 ENCOUNTER — Telehealth: Payer: Self-pay | Admitting: Primary Care

## 2015-07-04 ENCOUNTER — Other Ambulatory Visit: Payer: Self-pay | Admitting: Primary Care

## 2015-07-04 MED ORDER — LEVOTHYROXINE SODIUM 50 MCG PO TABS: ORAL_TABLET | ORAL | Status: DC

## 2015-07-04 NOTE — Addendum Note (Signed)
Addended by: Jacqualin Combes on: 07/04/2015 04:17 PM   Modules accepted: Orders

## 2015-07-04 NOTE — Telephone Encounter (Signed)
Okay to fill. 2 refills please. Same directions.

## 2015-07-04 NOTE — Telephone Encounter (Signed)
Received refill faxed request from [facility] for   levothyroxine (SYNTHROID, LEVOTHROID) 50 MCG tablet   Dispense 100  Refill  2   Sig: Take one and 1/2 tablet on M,W,Fri and one tablet on T,Th,Sat, Sun.  Rx has not been prescribed by Anda Kraft and last prescribed was 03/14/13.  Last TSH was 07/19/14. Last seen on 06/06/15. Next appointment CPE on 08/14/15.

## 2015-07-04 NOTE — Telephone Encounter (Signed)
Done

## 2015-07-07 NOTE — Telephone Encounter (Signed)
Electronically refill request for   levothyroxine (SYNTHROID, LEVOTHROID) 50 MCG tablet    Take one and 1/2 tablet on M,W,Fri and one tablet on T,Th,Sat, Sun Dispense: 100 tablet   Refills: 2     Note from pharmacy that patient request 90 days supply and Rx was just prescribed on 07/04/15.  Last seen on 06/06/15. Next CPE appt on 08/14/15.

## 2015-08-01 ENCOUNTER — Other Ambulatory Visit: Payer: Self-pay | Admitting: Primary Care

## 2015-08-01 DIAGNOSIS — E559 Vitamin D deficiency, unspecified: Secondary | ICD-10-CM

## 2015-08-01 DIAGNOSIS — E785 Hyperlipidemia, unspecified: Secondary | ICD-10-CM

## 2015-08-01 DIAGNOSIS — E039 Hypothyroidism, unspecified: Secondary | ICD-10-CM

## 2015-08-08 ENCOUNTER — Other Ambulatory Visit (INDEPENDENT_AMBULATORY_CARE_PROVIDER_SITE_OTHER): Payer: Federal, State, Local not specified - PPO

## 2015-08-08 DIAGNOSIS — E559 Vitamin D deficiency, unspecified: Secondary | ICD-10-CM

## 2015-08-08 DIAGNOSIS — E785 Hyperlipidemia, unspecified: Secondary | ICD-10-CM | POA: Diagnosis not present

## 2015-08-08 DIAGNOSIS — E039 Hypothyroidism, unspecified: Secondary | ICD-10-CM

## 2015-08-08 LAB — COMPREHENSIVE METABOLIC PANEL
ALBUMIN: 4 g/dL (ref 3.5–5.2)
ALK PHOS: 66 U/L (ref 39–117)
ALT: 15 U/L (ref 0–35)
AST: 20 U/L (ref 0–37)
BUN: 16 mg/dL (ref 6–23)
CALCIUM: 9.3 mg/dL (ref 8.4–10.5)
CO2: 32 mEq/L (ref 19–32)
CREATININE: 1.01 mg/dL (ref 0.40–1.20)
Chloride: 104 mEq/L (ref 96–112)
GFR: 72.36 mL/min (ref >60.00)
Glucose, Bld: 93 mg/dL (ref 70–99)
Potassium: 3.9 mEq/L (ref 3.5–5.1)
Sodium: 141 mEq/L (ref 135–145)
TOTAL PROTEIN: 7.2 g/dL (ref 6.0–8.3)
Total Bilirubin: 0.4 mg/dL (ref 0.2–1.2)

## 2015-08-08 LAB — LIPID PANEL
Cholesterol: 201 mg/dL — ABNORMAL HIGH (ref 0–200)
HDL: 60.6 mg/dL (ref >39.00)
LDL Cholesterol: 128 mg/dL — ABNORMAL HIGH (ref 0–99)
NonHDL: 139.97
Total CHOL/HDL Ratio: 3
Triglycerides: 60 mg/dL (ref 0.0–149.0)
VLDL: 12 mg/dL (ref 0.0–40.0)

## 2015-08-08 LAB — TSH: TSH: 0.97 u[IU]/mL (ref 0.35–4.50)

## 2015-08-08 LAB — VITAMIN D 25 HYDROXY (VIT D DEFICIENCY, FRACTURES): VITD: 20.69 ng/mL — ABNORMAL LOW (ref 30.00–100.00)

## 2015-08-14 ENCOUNTER — Ambulatory Visit (INDEPENDENT_AMBULATORY_CARE_PROVIDER_SITE_OTHER): Payer: Federal, State, Local not specified - PPO | Admitting: Primary Care

## 2015-08-14 ENCOUNTER — Encounter: Payer: Self-pay | Admitting: Primary Care

## 2015-08-14 VITALS — BP 116/74 | HR 64 | Temp 97.4°F | Ht 64.0 in | Wt 150.8 lb

## 2015-08-14 DIAGNOSIS — E559 Vitamin D deficiency, unspecified: Secondary | ICD-10-CM

## 2015-08-14 DIAGNOSIS — E039 Hypothyroidism, unspecified: Secondary | ICD-10-CM | POA: Diagnosis not present

## 2015-08-14 DIAGNOSIS — E785 Hyperlipidemia, unspecified: Secondary | ICD-10-CM | POA: Diagnosis not present

## 2015-08-14 MED ORDER — VITAMIN D (ERGOCALCIFEROL) 1.25 MG (50000 UNIT) PO CAPS: ORAL_CAPSULE | ORAL | Status: AC

## 2015-08-14 NOTE — Assessment & Plan Note (Signed)
Lipid panel improved since 1 year ago. Continue Simvastatin 10 mg. LFT's WNL. Denies myalgias.

## 2015-08-14 NOTE — Progress Notes (Signed)
Subjective:    Patient ID: [name], female    DOB: 1956-08-28, 59 y.o.   MRN: [id number]  HPI  [name] is a 59 year old female who presents today for follow up.  Immunizations: -Tetanus: Completed in 2014. -Influenza: Completed last week at work.  1) Hyperlipidemia: Currently managed on Simvastatin 10 mg tablets. Lipid panel from 08/08/15 with improvement since 1 year ago. She is working to improve her diet and exercise. Her diet currently consists of:  Diet: She endorses a fair diet. Breakfast: Oatmeal or grits from home Lunch: Salad, protein, left overs from dinner Dinner: Chicken, salmon, veggies, limited breads Snacks: Nuts, fruit, candy Desserts: Candy, pie on the weekends and 1-2 times during the week. Exercise: Occasionally walking 1.5 miles.  2) Vitamin D deficiency: History of in the past. Vitamin D leve of 20.69 from labs on 10/21 which is a decrease from over 1 year ago. She is not currently managed on treatment OTC or RX. Last Dexa scan was normal in 2012.  3) Hypothyroidism: Currently managed on levothyroxine 50 mcg since thyroid removed. TSH from 10/21 stable. Denies palpitations, fatigue, unexpected weight gain, hair loss.  Review of Systems  Constitutional: Negative for fatigue.  Respiratory: Negative for shortness of breath.   Cardiovascular: Negative for chest pain and palpitations.  Endocrine: Negative for cold intolerance.  Musculoskeletal: Negative for myalgias and arthralgias.       Past Medical History  Diagnosis Date  . Goiter   . Hyperlipidemia   . Osteopenia   . Liver hemangioma   . Leukopenia   . Vitamin D deficiency   . Thrombocytopenia ([place])   . Colon polyps   . GERD (gastroesophageal reflux disease)     Social History   Social History  . Marital Status: Married    Spouse Name: N/A  . Number of Children: 2  . Years of Education: N/A   Occupational History  . Works for [employer]    Social History Main Topics   . Smoking status: Never Smoker   . Smokeless tobacco: Never Used  . Alcohol Use: No  . Drug Use: No  . Sexual Activity: Yes    Birth Control/ Protection: Post-menopausal   Other Topics Concern  . Not on file   Social History Narrative   Married.   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     Past Surgical History  Procedure Laterality Date  . Thyroidectomy, partial  04/2009  . Mouth surgery      Family History  Problem Relation Age of Onset  . Cervical cancer Mother   . Diabetes Father   . Cervical cancer Sister   . Cancer Brother     brain  . Diabetes Brother   . Arthritis Sister     rheumatoid    Allergies  Allergen Reactions  . Codeine Nausea Only  . Lactose Intolerance (Gi)     Current Outpatient Prescriptions on File Prior to Visit  Medication Sig Dispense Refill  . Calcium Carbonate-Vitamin D (CALCIUM + D PO) Take 1 tablet by mouth daily.    Marland Kitchen levothyroxine (SYNTHROID, LEVOTHROID) 50 MCG tablet TAKE 1&1/2 TABLETS BY MOUTH ON MONDAY, WEDNESDAY, AND FRIDAY AND TAKE 1 TABLET BY MOUTH ON TUESDAY, THURSDAY, SATURDAY, AND SUNDAY 109 tablet 2  . LORazepam (ATIVAN) 1 MG tablet Take 1/2 tablet as needed every 12 hours.  May repeat in 30 mins if needed 20 tablet 1  .  simvastatin (ZOCOR) 10 MG tablet Take 1 tablet (10 mg total) by mouth at bedtime. 90 tablet 3   No current facility-administered medications on file prior to visit.    BP 116/74 mmHg  Pulse 64  Temp(Src) 97.4 F (36.3 C) (Oral)  Ht 5\' 4"  (1.626 m)  Wt 150 lb 12.8 oz (68.402 kg)  BMI 25.87 kg/m2  SpO2 97%    Objective:   Physical Exam  Constitutional: She is oriented to person, place, and time. She appears well-nourished.  Neck: Neck supple. No thyromegaly present.  Cardiovascular: Normal rate and regular rhythm.   Pulmonary/Chest: Effort normal and breath sounds normal.  Neurological: She is alert and oriented to person, place, and time.  Skin: Skin is  warm and dry.  Psychiatric: She has a normal mood and affect.          Assessment & Plan:

## 2015-08-14 NOTE — Progress Notes (Signed)
Pre visit review using our clinic review tool, if applicable. No additional management support is needed unless otherwise documented below in the visit note. 

## 2015-08-14 NOTE — Patient Instructions (Addendum)
Your cholesterol looks better, continue to take your Flax Seed. Improve your diet by limiting red meats, fried foods, fast foods.  Start exercising. You should be getting 1 hour of moderate intensity exercise 5 days weekly.  Try using vaginal moisturizers such as [place]pl[place] and [place].  Start Vitamin D. Take 1 capsule by mouth once weekly for a total of 12 weeks.  Schedule a lab only appointment in 12 weeks for re-evaluation.  Follow up in 1 year for physical.   It was a pleasure to see you today!  High Cholesterol High cholesterol refers to having a high level of cholesterol in your blood. Cholesterol is a white, waxy, fat-like protein that your body needs in small amounts. Your liver makes all the cholesterol you need. Excess cholesterol comes from the food you eat. Cholesterol travels in your bloodstream through your blood vessels. If you have high cholesterol, deposits (plaque) may build up on the walls of your blood vessels. This makes the arteries narrower and stiffer. Plaque increases your risk of heart attack and stroke. Work with your health care provider to keep your cholesterol levels in a healthy range. RISK FACTORS Several things can make you more likely to have high cholesterol. These include:   Eating foods high in animal fat (saturated fat) or cholesterol.  Being overweight.  Not getting enough exercise.  Having a family history of high cholesterol. SIGNS AND SYMPTOMS High cholesterol does not cause symptoms. DIAGNOSIS  Your health care provider can do a blood test to check whether you have high cholesterol. If you are older than 20, your health care provider may check your cholesterol every 4-6 years. You may be checked more often if you already have high cholesterol or other risk factors for heart disease. The blood test for cholesterol measures the following:  Bad cholesterol (LDL cholesterol). This is the type of cholesterol that causes heart disease. This number should  be less than 100.  Good cholesterol (HDL cholesterol). This type helps protect against heart disease. A healthy level of HDL cholesterol is 60 or higher.  Total cholesterol. This is the combined number of LDL cholesterol and HDL cholesterol. A healthy number is less than 200. TREATMENT  High cholesterol can be treated with diet changes, lifestyle changes, and medicine.   Diet changes may include eating more whole grains, fruits, vegetables, nuts, and fish. You may also have to cut back on red meat and foods with a lot of added sugar.  Lifestyle changes may include getting at least 40 minutes of aerobic exercise three times a week. Aerobic exercises include walking, biking, and swimming. Aerobic exercise along with a healthy diet can help you maintain a healthy weight. Lifestyle changes may also include quitting smoking.  If diet and lifestyle changes are not enough to lower your cholesterol, your health care provider may prescribe a statin medicine. This medicine has been shown to lower cholesterol and also lower the risk of heart disease. HOME CARE INSTRUCTIONS  Only take over-the-counter or prescription medicines as directed by your health care provider.   Follow a healthy diet as directed by your health care provider. For instance:   Eat chicken (without skin), fish, veal, shellfish, ground [place] breast, and round or loin cuts of red meat.  Do not eat fried foods and fatty meats, such as hot dogs and salami.   Eat plenty of fruits, such as apples.   Eat plenty of vegetables, such as broccoli, potatoes, and carrots.   Eat beans, peas, and  lentils.   Eat grains, such as barley, rice, couscous, and bulgur wheat.   Eat pasta without cream sauces.   Use skim or nonfat milk and low-fat or nonfat yogurt and cheeses. Do not eat or drink whole milk, cream, ice cream, egg yolks, and hard cheeses.   Do not eat stick margarine or tub margarines that contain trans fats (also called  partially hydrogenated oils).   Do not eat cakes, cookies, crackers, or other baked goods that contain trans fats.   Do not eat saturated tropical oils, such as coconut and palm oil.   Exercise as directed by your health care provider. Increase your activity level with activities such as gardening or walking.   Keep all follow-up appointments.  SEEK MEDICAL CARE IF:  You are struggling to maintain a healthy diet or weight.  You need help starting an exercise program.  You need help to stop smoking. SEEK IMMEDIATE MEDICAL CARE IF:  You have chest pain.  You have trouble breathing.   This information is not intended to replace advice given to you by your health care provider. Make sure you discuss any questions you have with your health care provider.   Document Released: 10/04/2005 Document Revised: 10/25/2014 Document Reviewed: 07/27/2013 Elsevier Interactive Patient Education [employer].

## 2015-08-14 NOTE — Assessment & Plan Note (Signed)
Level of 20.69 which is decreased from over 1 year ago. RX sent for 50,000 units once weekly x 12 weeks. Will recheck labs in 3 months.

## 2015-08-14 NOTE — Assessment & Plan Note (Signed)
TSH WNL. Continue levothyroxine 50 mcg

## 2015-08-24 ENCOUNTER — Ambulatory Visit (INDEPENDENT_AMBULATORY_CARE_PROVIDER_SITE_OTHER): Payer: Federal, State, Local not specified - PPO | Admitting: Family Medicine

## 2015-08-24 VITALS — BP 110/70 | HR 91 | Temp 97.4°F | Resp 18 | Ht 64.0 in | Wt 147.8 lb

## 2015-08-24 DIAGNOSIS — J0100 Acute maxillary sinusitis, unspecified: Secondary | ICD-10-CM

## 2015-08-24 MED ORDER — AMOXICILLIN 500 MG PO CAPS: 500.0000 mg | ORAL_CAPSULE | Freq: Three times a day (TID) | ORAL | Status: DC

## 2015-08-24 NOTE — Progress Notes (Signed)
@[facility] @  This chart was scribed for Robyn Haber, MD by Thea Alken, ED Scribe. This patient was seen in room 8 and the patient's care was started at 11:44 AM.  Patient ID: [name] MRN: [id number], DOB: Dec 16, 1955, 59 y.o. Date of Encounter: 08/24/2015, 11:42 AM  Primary Physician: Sheral Flow, NP  Chief Complaint:  Chief Complaint  Patient presents with  . Nasal Congestion    started wednesday   . Cough  . Sore Throat    HPI: 59 y.o. year old female with history below presents with nasal congestion for 3 days ago. She reports associated loss of smell and taste as well as cough, sinus tenderness, decreased appetite and diarrhea. She coughs more in the morning. Has hx of sinus infections. Pt is not a smoker. She is allergic to codeine. 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    Past Medical History  Diagnosis Date  . Goiter   . Hyperlipidemia   . Osteopenia   . Liver hemangioma   . Leukopenia   . Vitamin D deficiency   . Thrombocytopenia ([place])   . Colon polyps   . GERD (gastroesophageal reflux disease)      Home Meds: Prior to Admission medications   Medication Sig Start Date End Date Taking? Authorizing Provider  Calcium Carbonate-Vitamin D (CALCIUM + D PO) Take 1 tablet by mouth daily.   Yes Historical Provider, MD  levothyroxine (SYNTHROID, LEVOTHROID) 50 MCG tablet TAKE 1&1/2 TABLETS BY MOUTH ON MONDAY, WEDNESDAY, AND FRIDAY AND TAKE 1 TABLET BY MOUTH ON TUESDAY, THURSDAY, SATURDAY, AND SUNDAY 07/07/15  Yes Pleas Koch, NP  simvastatin (ZOCOR) 10 MG tablet Take 1 tablet (10 mg total) by mouth at bedtime. 06/06/15  Yes Pleas Koch, NP  Vitamin D, Ergocalciferol, (DRISDOL) 50000 UNITS CAPS capsule Take 1 capsule by mouth once weekly for a total of 12 weeks. 08/14/15  Yes Pleas Koch, NP  LORazepam (ATIVAN) 1 MG tablet Take 1/2 tablet as needed every 12 hours.  May repeat in 30 mins if  needed Patient not taking: Reported on 08/24/2015 12/05/13   Lanice Shirts, MD    Allergies:  Allergies  Allergen Reactions  . Codeine Nausea Only  . Lactose Intolerance (Gi)     Social History   Social History  . Marital Status: Married    Spouse Name: N/A  . Number of Children: 2  . Years of Education: N/A   Occupational History  . Works for [employer]    Social History Main Topics  . Smoking status: Never Smoker   . Smokeless tobacco: Never Used  . Alcohol Use: No  . Drug Use: No  . Sexual Activity: Yes    Birth Control/ Protection: Post-menopausal   Other Topics Concern  . Not on file   Social History Narrative   Married.   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     Review of Systems: Constitutional: negative for chills, fever, night sweats, weight changes, or fatigue  HEENT: negative for vision changes, hearing loss, rhinorrhea or epistaxis. Cardiovascular: negative for chest pain or palpitations Respiratory: negative for hemoptysis, wheezing, shortness of breath. Abdominal: negative for abdominal pain, nausea, vomiting, diarrhea, or constipation Dermatological: negative for rash Neurologic: negative for headache, dizziness, or syncope All other systems reviewed and are otherwise negative with the exception to those above and in the HPI.  Physical Exam: Blood pressure 110/70, pulse 91, temperature 97.4 F (36.3 C), temperature source Oral, resp. rate 18, height 5\' 4"  (1.626 m), weight 147 lb 12.8 oz (67.042 kg), SpO2 99 %., Body mass index is 25.36 kg/(m^2). General: Well developed, well nourished, in no acute distress. Head: Normocephalic, atraumatic, eyes without discharge, sclera non-icteric, nares are without discharge. Bilateral auditory canals clear, TM's are without perforation, pearly grey and translucent with reflective cone of light bilaterally. Oral cavity moist, posterior pharynx without  exudate, erythema, peritonsillar abscess, or post nasal drip.  Purulent discharge bilateral nasal passages  Neck: Supple. No thyromegaly. Full ROM. No lymphadenopathy. Lungs: Clear bilaterally to auscultation without wheezes, rales, or rhonchi. Coarse breath sounds. Heart: RRR with S1 S2. No murmurs, rubs, or gallops appreciated. Abdomen: Soft, non-tender, non-distended with normoactive bowel sounds. No hepatomegaly. No rebound/guarding. No obvious abdominal masses. Msk:  Strength and tone normal for age. Extremities/Skin: Warm and dry. No clubbing or cyanosis. No edema. No rashes or suspicious lesions. Neuro: Alert and oriented X 3. Moves all extremities spontaneously. Gait is normal. CNII-XII grossly in tact. Psych:  Responds to questions appropriately with a normal affect.   ASSESSMENT AND PLAN:  59 y.o. year old female with sinuitis  This chart was scribed in my presence and reviewed by me personally.    ICD-9-CM ICD-10-CM   1. Acute maxillary sinusitis, recurrence not specified 461.0 J01.00 amoxicillin (AMOXIL) 500 MG capsule     By signing my name below, I, Raven Small, attest that this documentation has been prepared under the direction and in the presence of Robyn Haber, MD.  Electronically Signed: Thea Alken, ED Scribe. 08/24/2015. 11:49 AM.  Signed, Robyn Haber, MD 08/24/2015 11:42 AM

## 2015-08-24 NOTE — Patient Instructions (Signed)

## 2015-08-29 ENCOUNTER — Ambulatory Visit (INDEPENDENT_AMBULATORY_CARE_PROVIDER_SITE_OTHER): Payer: Federal, State, Local not specified - PPO | Admitting: Emergency Medicine

## 2015-08-29 VITALS — BP 104/64 | HR 82 | Temp 98.0°F | Resp 16 | Ht 64.0 in | Wt 148.8 lb

## 2015-08-29 DIAGNOSIS — J0140 Acute pansinusitis, unspecified: Secondary | ICD-10-CM

## 2015-08-29 MED ORDER — PSEUDOEPHEDRINE-GUAIFENESIN ER 60-600 MG PO TB12: 1.0000 | ORAL_TABLET | Freq: Two times a day (BID) | ORAL | Status: AC

## 2015-08-29 MED ORDER — AMOXICILLIN-POT CLAVULANATE 875-125 MG PO TABS: 1.0000 | ORAL_TABLET | Freq: Two times a day (BID) | ORAL | Status: DC

## 2015-08-29 NOTE — Patient Instructions (Signed)

## 2015-08-29 NOTE — Progress Notes (Signed)
Subjective:  Patient ID: [name], female    DOB: 19-Feb-1956  Age: 59 y.o. MRN: [id number]  CC: Follow-up   HPI [name] presents  for recheck of her sinusitis she was seen and has not improved. She put on amoxicillin with no improvement. She still has purulent nasal drainage and postnasal drip. She has no cough or fever. Wheezing or shortness of breath. No nausea or vomiting. She still has a headache and pressure on her sinuses.  History [name] has a past medical history of Goiter; Hyperlipidemia; Osteopenia; Liver hemangioma; Leukopenia; Vitamin D deficiency; Thrombocytopenia ([place]); Colon polyps; and GERD (gastroesophageal reflux disease).   She has past surgical history that includes Thyroidectomy, partial (04/2009) and Mouth surgery.   Her  family history includes Arthritis in her sister; Cancer in her brother; Cervical cancer in her mother and sister; Diabetes in her brother and father.  She   reports that she has never smoked. She has never used smokeless tobacco. She reports that she does not drink alcohol or use illicit drugs.  Outpatient Prescriptions Prior to Visit  Medication Sig Dispense Refill  . amoxicillin (AMOXIL) 500 MG capsule Take 1 capsule (500 mg total) by mouth 3 (three) times daily. 30 capsule 0  . Calcium Carbonate-Vitamin D (CALCIUM + D PO) Take 1 tablet by mouth daily.    Marland Kitchen levothyroxine (SYNTHROID, LEVOTHROID) 50 MCG tablet TAKE 1&1/2 TABLETS BY MOUTH ON MONDAY, WEDNESDAY, AND FRIDAY AND TAKE 1 TABLET BY MOUTH ON TUESDAY, THURSDAY, SATURDAY, AND SUNDAY 109 tablet 2  . simvastatin (ZOCOR) 10 MG tablet Take 1 tablet (10 mg total) by mouth at bedtime. 90 tablet 3  . Vitamin D, Ergocalciferol, (DRISDOL) 50000 UNITS CAPS capsule Take 1 capsule by mouth once weekly for a total of 12 weeks. 4 capsule 2  . LORazepam (ATIVAN) 1 MG tablet Take 1/2 tablet as needed every 12 hours.  May repeat in 30 mins if needed (Patient not taking: Reported on 08/24/2015) 20  tablet 1   No facility-administered medications prior to visit.    Social History   Social History  . Marital Status: Married    Spouse Name: N/A  . Number of Children: 2  . Years of Education: N/A   Occupational History  . Works for [employer]    Social History Main Topics  . Smoking status: Never Smoker   . Smokeless tobacco: Never Used  . Alcohol Use: No  . Drug Use: No  . Sexual Activity: Yes    Birth Control/ Protection: Post-menopausal   Other Topics Concern  . None   Social History Narrative   Married.   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      Review of Systems  Constitutional: Negative for fever, chills and appetite change.  HENT: Positive for congestion, postnasal drip, rhinorrhea and sinus pressure. Negative for ear pain and sore throat.   Eyes: Negative for pain and redness.  Respiratory: Negative for cough, shortness of breath and wheezing.   Cardiovascular: Negative for leg swelling.  Gastrointestinal: Negative for nausea, vomiting, abdominal pain, diarrhea, constipation and blood in stool.  Endocrine: Negative for polyuria.  Genitourinary: Negative for dysuria, urgency, frequency and flank pain.  Musculoskeletal: Negative for gait problem.  Skin: Negative for rash.  Neurological: Negative for weakness and headaches.  Psychiatric/Behavioral: Negative for confusion and decreased concentration. The patient is not nervous/anxious.     Objective:  BP 104/64 mmHg  Pulse 82  Temp(Src) 98 F (36.7 C) (Oral)  Resp 16  Ht 5\' 4"  (1.626 m)  Wt 148 lb 12.8 oz (67.495 kg)  BMI 25.53 kg/m2  SpO2 99%  Physical Exam  Constitutional: She is oriented to person, place, and time. She appears well-developed and well-nourished.  HENT:  Head: Normocephalic and atraumatic.  Eyes: Conjunctivae are normal. Pupils are equal, round, and reactive to light.  Pulmonary/Chest: Effort normal.  Musculoskeletal: She  exhibits no edema.  Neurological: She is alert and oriented to person, place, and time.  Skin: Skin is dry.  Psychiatric: She has a normal mood and affect. Her behavior is normal. Thought content normal.      Assessment & Plan:   [name] was seen today for follow-up.  Diagnoses and all orders for this visit:  Acute pansinusitis, recurrence not specified  Other orders -     amoxicillin-clavulanate (AUGMENTIN) 875-125 MG tablet; Take 1 tablet by mouth 2 (two) times daily. -     pseudoephedrine-guaifenesin (MUCINEX D) 60-600 MG 12 hr tablet; Take 1 tablet by mouth every 12 (twelve) hours.   I am having Ms. Klug start on amoxicillin-clavulanate and pseudoephedrine-guaifenesin. I am also having her maintain her Calcium Carbonate-Vitamin D (CALCIUM + D PO), LORazepam, simvastatin, levothyroxine, Vitamin D (Ergocalciferol), and amoxicillin.  Meds ordered this encounter  Medications  . amoxicillin-clavulanate (AUGMENTIN) 875-125 MG tablet    Sig: Take 1 tablet by mouth 2 (two) times daily.    Dispense:  20 tablet    Refill:  0  . pseudoephedrine-guaifenesin (MUCINEX D) 60-600 MG 12 hr tablet    Sig: Take 1 tablet by mouth every 12 (twelve) hours.    Dispense:  18 tablet    Refill:  0    Appropriate red flag conditions were discussed with the patient as well as actions that should be taken.  Patient expressed his understanding.  Follow-up: Return if symptoms worsen or fail to improve.  Roselee Culver, MD

## 2015-10-28 ENCOUNTER — Other Ambulatory Visit: Payer: Self-pay | Admitting: *Deleted

## 2015-10-28 ENCOUNTER — Other Ambulatory Visit: Payer: Self-pay | Admitting: Internal Medicine

## 2015-10-28 DIAGNOSIS — R109 Unspecified abdominal pain: Secondary | ICD-10-CM

## 2015-10-29 ENCOUNTER — Other Ambulatory Visit: Payer: Self-pay | Admitting: Internal Medicine

## 2015-10-29 DIAGNOSIS — R748 Abnormal levels of other serum enzymes: Secondary | ICD-10-CM

## 2015-10-29 DIAGNOSIS — R102 Pelvic and perineal pain: Secondary | ICD-10-CM

## 2015-10-29 DIAGNOSIS — R109 Unspecified abdominal pain: Principal | ICD-10-CM

## 2015-11-04 ENCOUNTER — Ambulatory Visit
Admission: RE | Admit: 2015-11-04 | Discharge: 2015-11-04 | Disposition: A | Discharge status: Home / Self Care | Payer: Federal, State, Local not specified - PPO | Source: Ambulatory Visit | Attending: Internal Medicine | Admitting: Internal Medicine

## 2015-11-04 DIAGNOSIS — R102 Pelvic and perineal pain: Secondary | ICD-10-CM

## 2015-11-04 DIAGNOSIS — R748 Abnormal levels of other serum enzymes: Secondary | ICD-10-CM

## 2015-11-04 DIAGNOSIS — R109 Unspecified abdominal pain: Principal | ICD-10-CM

## 2015-11-04 MED ORDER — IOPAMIDOL (ISOVUE-300) INJECTION 61%
100.0000 mL | Freq: Once | INTRAVENOUS | Status: AC | PRN
  Administered 2015-11-04: 100 mL via INTRAVENOUS

## 2015-11-06 ENCOUNTER — Other Ambulatory Visit: Payer: Federal, State, Local not specified - PPO

## 2015-12-19 ENCOUNTER — Other Ambulatory Visit: Payer: Self-pay | Admitting: Internal Medicine

## 2015-12-19 ENCOUNTER — Other Ambulatory Visit (HOSPITAL_COMMUNITY)
Admission: RE | Admit: 2015-12-19 | Discharge: 2015-12-19 | Disposition: A | Discharge status: Home / Self Care | Payer: Federal, State, Local not specified - PPO | Source: Ambulatory Visit | Attending: Internal Medicine | Admitting: Internal Medicine

## 2015-12-19 DIAGNOSIS — Z01419 Encounter for gynecological examination (general) (routine) without abnormal findings: Secondary | ICD-10-CM | POA: Diagnosis present

## 2015-12-22 LAB — CYTOLOGY - PAP

## 2016-01-23 DIAGNOSIS — H1032 Unspecified acute conjunctivitis, left eye: Secondary | ICD-10-CM | POA: Diagnosis not present

## 2016-01-23 DIAGNOSIS — R748 Abnormal levels of other serum enzymes: Secondary | ICD-10-CM | POA: Diagnosis not present

## 2016-01-26 DIAGNOSIS — K080 Exfoliation of teeth due to systemic causes: Secondary | ICD-10-CM | POA: Diagnosis not present

## 2016-02-05 DIAGNOSIS — R1011 Right upper quadrant pain: Secondary | ICD-10-CM | POA: Diagnosis not present

## 2016-02-05 DIAGNOSIS — R7989 Other specified abnormal findings of blood chemistry: Secondary | ICD-10-CM | POA: Diagnosis not present

## 2016-02-05 DIAGNOSIS — Z86010 Personal history of colonic polyps: Secondary | ICD-10-CM | POA: Diagnosis not present

## 2016-05-11 DIAGNOSIS — K5900 Constipation, unspecified: Secondary | ICD-10-CM | POA: Diagnosis not present

## 2016-05-11 DIAGNOSIS — Z86010 Personal history of colonic polyps: Secondary | ICD-10-CM | POA: Diagnosis not present

## 2016-05-11 DIAGNOSIS — R1011 Right upper quadrant pain: Secondary | ICD-10-CM | POA: Diagnosis not present

## 2016-05-12 DIAGNOSIS — Z1231 Encounter for screening mammogram for malignant neoplasm of breast: Secondary | ICD-10-CM | POA: Diagnosis not present

## 2016-05-19 DIAGNOSIS — H04123 Dry eye syndrome of bilateral lacrimal glands: Secondary | ICD-10-CM | POA: Diagnosis not present

## 2016-05-19 DIAGNOSIS — H35372 Puckering of macula, left eye: Secondary | ICD-10-CM | POA: Diagnosis not present

## 2016-05-19 DIAGNOSIS — H33102 Unspecified retinoschisis, left eye: Secondary | ICD-10-CM | POA: Diagnosis not present

## 2016-06-25 ENCOUNTER — Other Ambulatory Visit: Payer: Self-pay | Admitting: Primary Care

## 2016-07-15 DIAGNOSIS — F418 Other specified anxiety disorders: Secondary | ICD-10-CM | POA: Diagnosis not present

## 2016-07-15 DIAGNOSIS — J301 Allergic rhinitis due to pollen: Secondary | ICD-10-CM | POA: Diagnosis not present

## 2016-07-15 DIAGNOSIS — R42 Dizziness and giddiness: Secondary | ICD-10-CM | POA: Diagnosis not present

## 2016-07-28 DIAGNOSIS — E7800 Pure hypercholesterolemia, unspecified: Secondary | ICD-10-CM | POA: Diagnosis not present

## 2016-08-16 DIAGNOSIS — K080 Exfoliation of teeth due to systemic causes: Secondary | ICD-10-CM | POA: Diagnosis not present

## 2016-11-24 ENCOUNTER — Other Ambulatory Visit: Payer: Self-pay | Admitting: Internal Medicine

## 2016-11-24 DIAGNOSIS — R202 Paresthesia of skin: Secondary | ICD-10-CM

## 2016-11-24 DIAGNOSIS — E7800 Pure hypercholesterolemia, unspecified: Secondary | ICD-10-CM | POA: Diagnosis not present

## 2016-11-24 DIAGNOSIS — R200 Anesthesia of skin: Secondary | ICD-10-CM

## 2016-11-25 ENCOUNTER — Ambulatory Visit
Admission: RE | Admit: 2016-11-25 | Discharge: 2016-11-25 | Disposition: A | Discharge status: Home / Self Care | Payer: Federal, State, Local not specified - PPO | Source: Ambulatory Visit | Attending: Internal Medicine | Admitting: Internal Medicine

## 2016-11-25 ENCOUNTER — Other Ambulatory Visit: Payer: Self-pay | Admitting: Internal Medicine

## 2016-11-25 DIAGNOSIS — M47812 Spondylosis without myelopathy or radiculopathy, cervical region: Secondary | ICD-10-CM | POA: Diagnosis not present

## 2016-11-25 DIAGNOSIS — R202 Paresthesia of skin: Secondary | ICD-10-CM

## 2016-11-25 IMAGING — CT CT CERVICAL SPINE W/O CM
3 of 6 series · 15 of 33 positions shown, 18 images · non-contrast
Comparison: None.

CLINICAL DATA: Left upper extremity radicular symptoms

EXAM:
CT CERVICAL SPINE WITHOUT CONTRAST
TECHNIQUE: Multidetector CT imaging of the cervical spine was performed without
intravenous contrast. Multiplanar CT image reconstructions were also
generated.

[Series 201: coronal #2 lower · coronal · 0.41mm/px · 3 of 29 slices shown]
[im 6/29  bone]
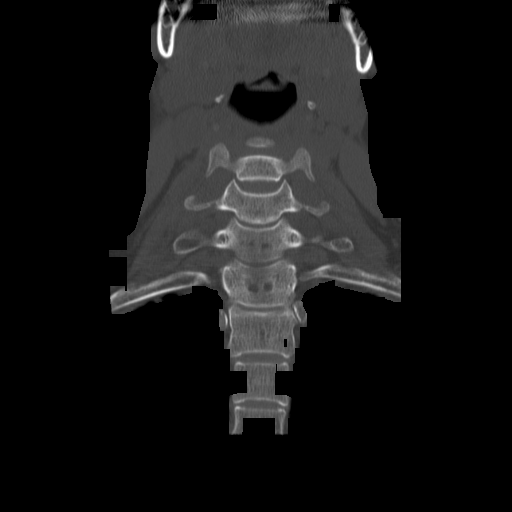
[im 11/29  bone]
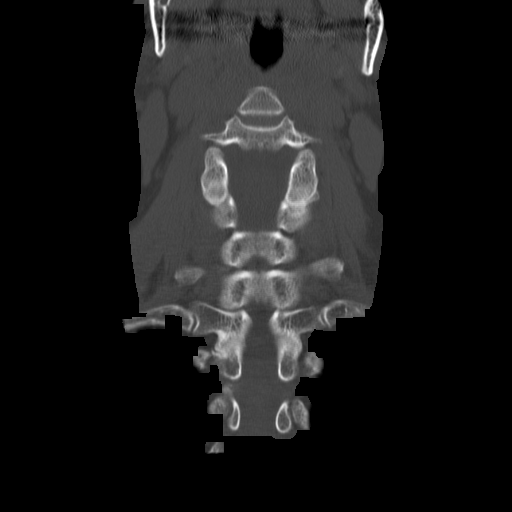
[im 17/29  bone]
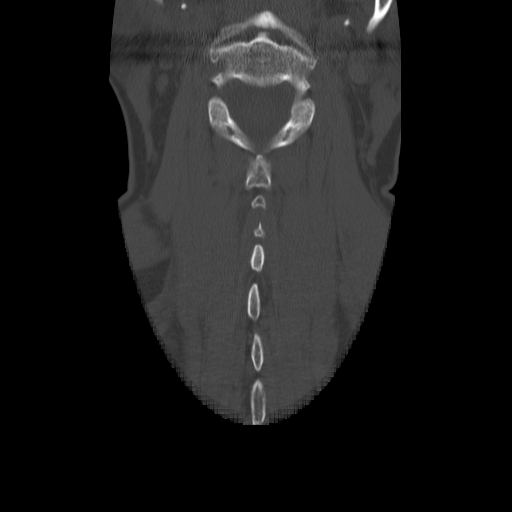

[Series 202: sagittal · sagittal · 0.41mm/px · 5 of 42 slices shown, 6 images]
[im 14/42  bone]
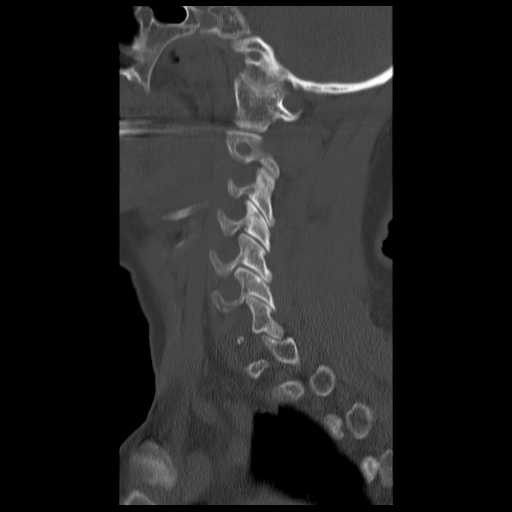
[im 18/42  bone]
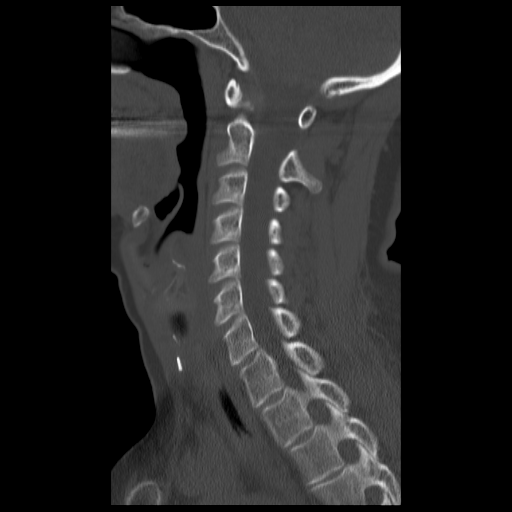
[im 21/42  soft-tissue]
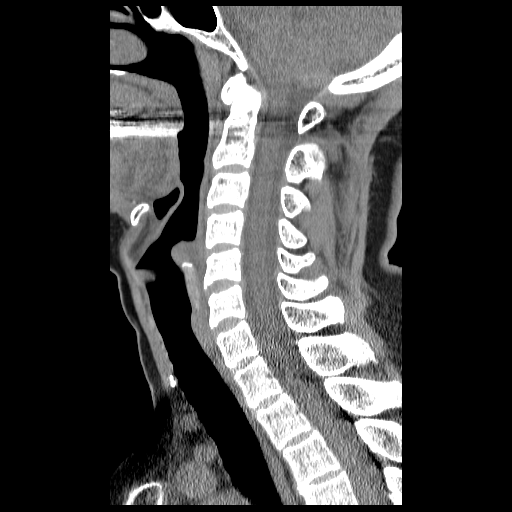
[im 21/42  bone]
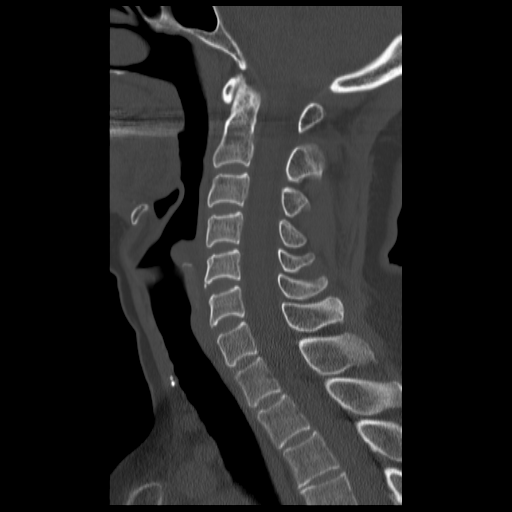
[im 24/42  bone]
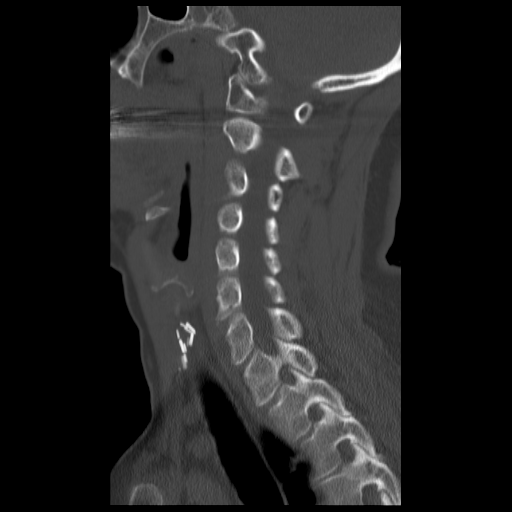
[im 28/42  bone]
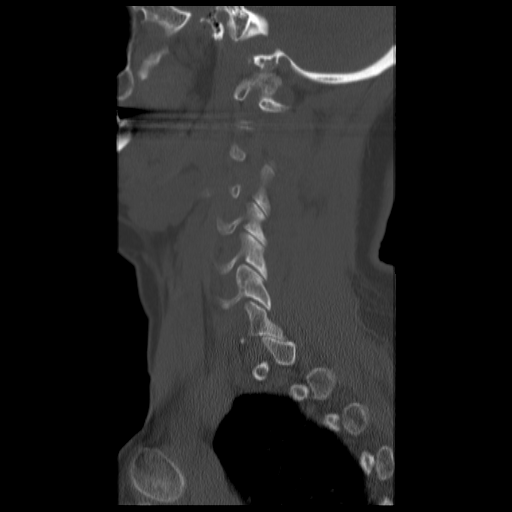

[Series 203: angled axial · axial · 0.29mm/px · z∈[+74,+227]mm · 7 of 293 slices shown, 9 images]
[im 37/293  soft-tissue]
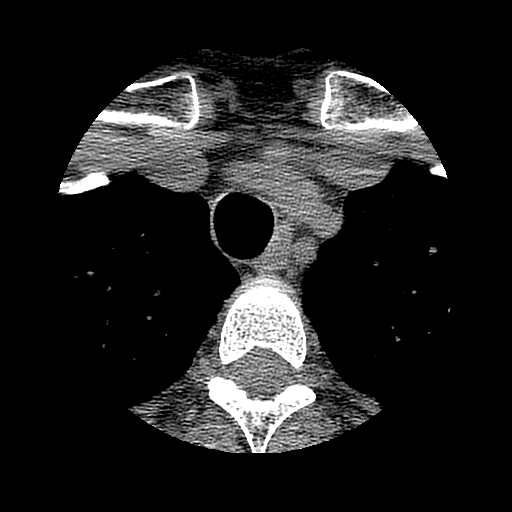
[im 37/293  bone]
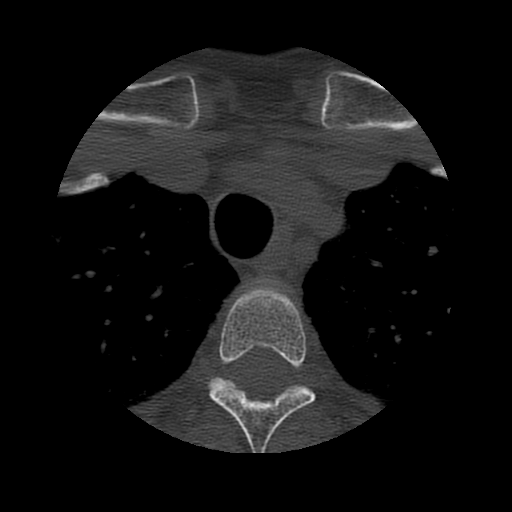
[im 74/293  bone]
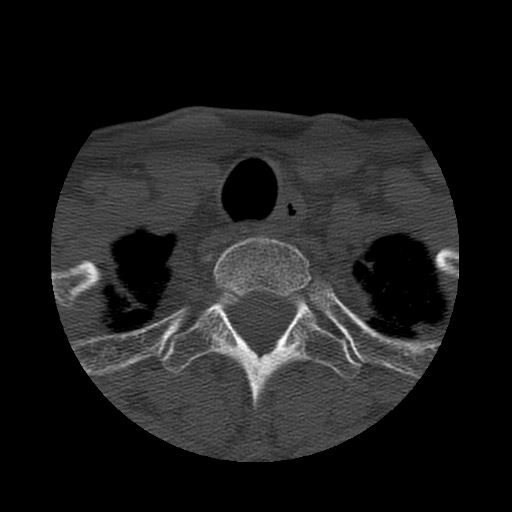
[im 110/293  bone]
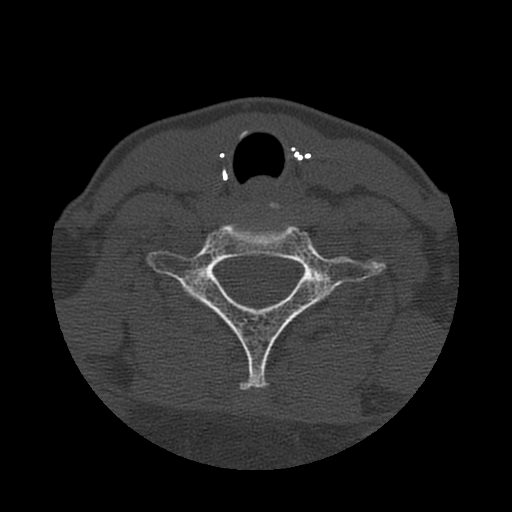
[im 147/293  bone]
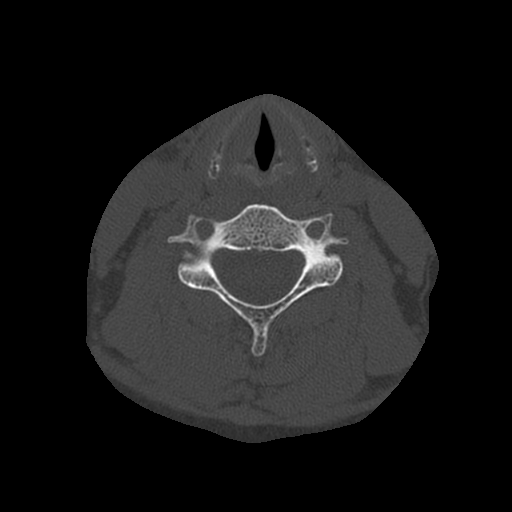
[im 183/293  soft-tissue]
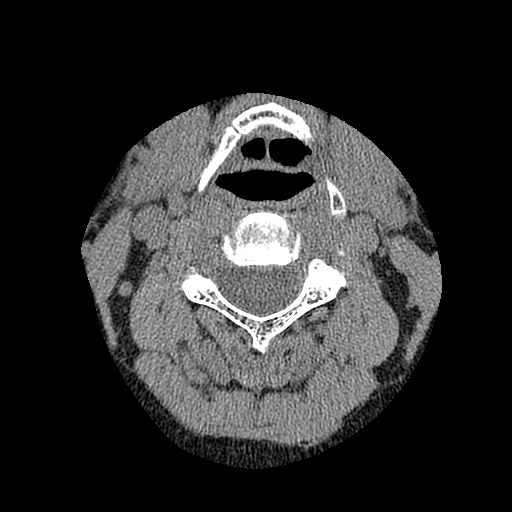
[im 183/293  bone]
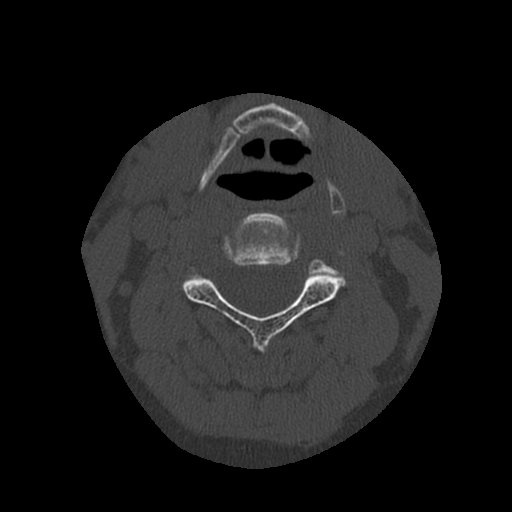
[im 220/293  bone]
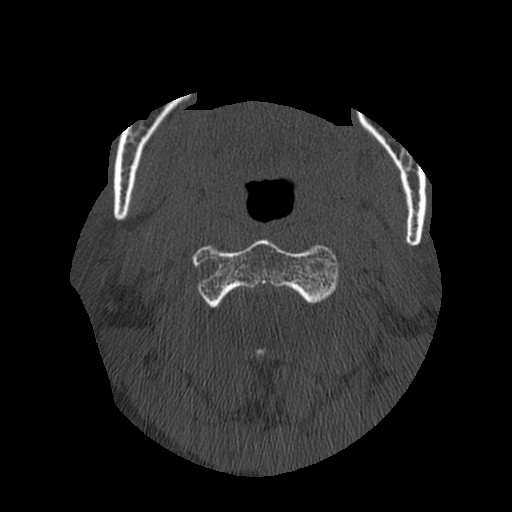
[im 256/293  bone]
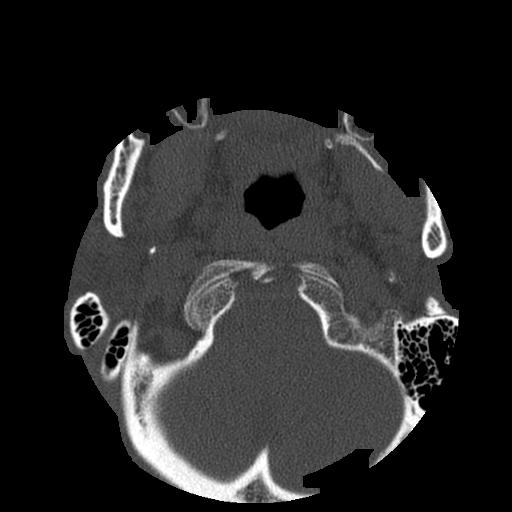

[15 of 33 positions shown; findings below may reference images not displayed]

FINDINGS: Alignment: There is no demonstrable cervical scoliosis. No
spondylolisthesis.

Skull base and vertebrae: The skull base and craniocervical junction
regions appear normal. No fracture. No blastic or lytic bone
lesions.

Soft tissues and spinal canal: No paraspinous lesions. Prevertebral
soft tissues and predental space regions are normal. No lesion
referable to the cervical or upper thoracic cord is evident on this
noncontrast enhanced study. No cord widening evident on noncontrast
enhanced study. No spinal stenosis.

Disc levels: There is no appreciable disc space narrowing. There is
no evident nerve root edema or effacement. No disc extrusion. There
is modest facet hypertrophy on the left at C3-4 and at C5-6 and C6-7
bilaterally.

Upper chest: Visualized lung apices are clear.

Other: Evidence of previous thyroidectomy.
IMPRESSION: There is no demonstrable nerve root edema or effacement on this
noncontrast enhanced study. No disc extrusion or stenosis evident.
No fracture or spondylolisthesis. Rather minimal osteoarthritic
change in the facets at several levels as noted above.

Patient is status post thyroidectomy.

## 2016-12-02 DIAGNOSIS — E7800 Pure hypercholesterolemia, unspecified: Secondary | ICD-10-CM | POA: Diagnosis not present

## 2016-12-02 DIAGNOSIS — R200 Anesthesia of skin: Secondary | ICD-10-CM | POA: Diagnosis not present

## 2016-12-22 ENCOUNTER — Other Ambulatory Visit: Payer: Self-pay | Admitting: Primary Care

## 2016-12-22 ENCOUNTER — Ambulatory Visit: Payer: Federal, State, Local not specified - PPO | Admitting: Neurology

## 2016-12-22 DIAGNOSIS — E039 Hypothyroidism, unspecified: Secondary | ICD-10-CM

## 2016-12-22 NOTE — Telephone Encounter (Signed)
Message left for patient to return my call.  

## 2016-12-22 NOTE — Telephone Encounter (Signed)
Ok to refill? Electronically refill request for levothyroxine (SYNTHROID, LEVOTHROID) 50 MCG tablet. Last prescribed on 07/04/2015. Last seen on 08/14/2015

## 2016-12-31 NOTE — Telephone Encounter (Addendum)
Spoken to patient and she stated that she is no longer coming to this office. She has been going [employer] [facility] [employer].

## 2017-02-14 ENCOUNTER — Encounter: Payer: Self-pay | Admitting: Neurology

## 2017-02-14 ENCOUNTER — Ambulatory Visit (INDEPENDENT_AMBULATORY_CARE_PROVIDER_SITE_OTHER): Payer: Federal, State, Local not specified - PPO | Admitting: Neurology

## 2017-02-14 VITALS — BP 110/78 | HR 64 | Ht 64.0 in | Wt 150.0 lb

## 2017-02-14 DIAGNOSIS — R200 Anesthesia of skin: Secondary | ICD-10-CM | POA: Diagnosis not present

## 2017-02-14 DIAGNOSIS — R202 Paresthesia of skin: Secondary | ICD-10-CM

## 2017-02-14 MED ORDER — WRIST SPLINT/COCK-UP/LEFT M MISC: 1.0000 | Freq: Every day | 0 refills | Status: DC

## 2017-02-14 NOTE — Progress Notes (Signed)
NEUROLOGY CONSULTATION NOTE  [name] MRN: [id number] DOB: 02/11/1956  Referring provider: Dr. Coralyn Mark Primary care provider: Dr. Coralyn Mark  Reason for consult:  Left arm numbness.  HISTORY OF PRESENT ILLNESS: [name] is a 61 year old right-handed female with hypothyroidism status post thyroidectomy, hyperlipidemia, thrombocytopenia and short PR-normal QRS complex syndrome who presents for left arm numbness.  History supplemented by PCP note.  She began experiencing left arm numbness in January or February.  She reports numbness and tingling in the 4th and 5th digits of the left hand that radiates up to the shoulder.  She notices it when she wakes up in the morning and she needs to shake out her arm with resolution in 2 to 3 minutes.  It occurs daily.  She also reports some pain in the left shoulder as well and mild neck pain.  She denies any precipitating factor, such as injury or strenuous activity.  Symptoms are brought on when she is at her desk typing but her seat is not properly leveled with her desk.  There is no associated weakness.  She denies numbness in the right hand.  Sometimes, when she sits too long, she may feel numbness in the feet.  CT of cervical spine from 11/25/16 was personally reviewed and revealed mild arthritic changes on left at C3-4, C5-6 and bilaterally at C6-7, but no obvious nerve root involvement.  PAST MEDICAL HISTORY: Past Medical History:  Diagnosis Date  . Colon polyps   . GERD (gastroesophageal reflux disease)   . Goiter   . Hyperlipidemia   . Leukopenia   . Liver hemangioma   . Osteopenia   . Thrombocytopenia ([place])   . Vitamin D deficiency     PAST SURGICAL HISTORY: Past Surgical History:  Procedure Laterality Date  . MOUTH SURGERY    . THYROIDECTOMY, PARTIAL  04/2009    MEDICATIONS: Current Outpatient Prescriptions on File Prior to Visit  Medication Sig Dispense Refill  . amoxicillin (AMOXIL) 500 MG capsule Take 1  capsule (500 mg total) by mouth 3 (three) times daily. 30 capsule 0  . amoxicillin-clavulanate (AUGMENTIN) 875-125 MG tablet Take 1 tablet by mouth 2 (two) times daily. 20 tablet 0  . Calcium Carbonate-Vitamin D (CALCIUM + D PO) Take 1 tablet by mouth daily.    Marland Kitchen levothyroxine (SYNTHROID, LEVOTHROID) 50 MCG tablet TAKE 1&1/2 TABLETS BY MOUTH ON MONDAY, WEDNESDAY, AND FRIDAY AND TAKE 1 TABLET BY MOUTH ON TUESDAY, THURSDAY, SATURDAY, AND SUNDAY 109 tablet 1  . levothyroxine (SYNTHROID, LEVOTHROID) 50 MCG tablet TAKE 1&1/2 TABLETS BY MOUTH ON MONDAY, WEDNESDAY, AND FRIDAY AND TAKE 1 TABLET BY MOUTH ON TUESDAY, THURSDAY, SATURDAY, AND SUNDAY 34 tablet 0  . LORazepam (ATIVAN) 1 MG tablet Take 1/2 tablet as needed every 12 hours.  May repeat in 30 mins if needed (Patient not taking: Reported on 08/24/2015) 20 tablet 1  . simvastatin (ZOCOR) 10 MG tablet Take 1 tablet (10 mg total) by mouth at bedtime. 90 tablet 3  . Vitamin D, Ergocalciferol, (DRISDOL) 50000 UNITS CAPS capsule Take 1 capsule by mouth once weekly for a total of 12 weeks. 4 capsule 2   No current facility-administered medications on file prior to visit.     ALLERGIES: Allergies  Allergen Reactions  . Codeine Nausea Only  . Lactose Intolerance (Gi)     FAMILY HISTORY: Family History  Problem Relation Age of Onset  . Cervical cancer Mother   . Diabetes Father   . Cervical cancer Sister   .  Cancer Brother     brain  . Diabetes Brother   . Arthritis Sister     rheumatoid    SOCIAL HISTORY: Social History   Social History  . Marital status: Married    Spouse name: N/A  . Number of children: 2  . Years of education: 14   Occupational History  . Works for [employer]   Social History Main Topics  . Smoking status: Never Smoker  . Smokeless tobacco: Never Used  . Alcohol use No  . Drug use: No  . Sexual activity: Yes    Birth control/ protection: Post-menopausal   Other Topics Concern  . Not on  file   Social History Narrative   Married.   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    Lives in a 2 story home.     Education: some college    REVIEW OF SYSTEMS: Constitutional: No fevers, chills, or sweats, no generalized fatigue, change in appetite Eyes: No visual changes, double vision, eye pain Ear, nose and throat: No hearing loss, ear pain, nasal congestion, sore throat Cardiovascular: No chest pain, palpitations Respiratory:  No shortness of breath at rest or with exertion, wheezes GastrointestinaI: No nausea, vomiting, diarrhea, abdominal pain, fecal incontinence Genitourinary:  No dysuria, urinary retention or frequency Musculoskeletal:  No neck pain, back pain Integumentary: No rash, pruritus, skin lesions Neurological: as above Psychiatric: No depression, insomnia, anxiety Endocrine: No palpitations, fatigue, diaphoresis, mood swings, change in appetite, change in weight, increased thirst Hematologic/Lymphatic:  No purpura, petechiae. Allergic/Immunologic: no itchy/runny eyes, nasal congestion, recent allergic reactions, rashes  PHYSICAL EXAM: Vitals:   02/14/17 0753  BP: 110/78  Pulse: 64   General: No acute distress.  Patient appears well-groomed.  Head:  Normocephalic/atraumatic Eyes:  fundi examined but not visualized Neck: supple, no paraspinal tenderness, full range of motion Back: No paraspinal tenderness Heart: regular rate and rhythm Lungs: Clear to auscultation bilaterally. Vascular: No carotid bruits. Neurological Exam: Mental status: alert and oriented to person, place, and time, recent and remote memory intact, fund of knowledge intact, attention and concentration intact, speech fluent and not dysarthric, language intact. Cranial nerves: CN I: not tested CN II: pupils equal, round and reactive to light, visual fields intact CN III, IV, VI:  full range of motion, no nystagmus, no ptosis CN V: facial sensation  intact CN VII: upper and lower face symmetric CN VIII: hearing intact CN IX, X: gag intact, uvula midline CN XI: sternocleidomastoid and trapezius muscles intact CN XII: tongue midline Bulk & Tone: normal, no fasciculations. Motor:  5/5 throughout  Sensation:  Decreased pinprick sensation on pads of both thumbs.  Otherwise, pinprick and vibration sensation intact. Deep Tendon Reflexes:  2+ throughout, toes downgoing.  Finger to nose testing:  Without dysmetria.  Heel to shin:  Without dysmetria.  Gait:  Normal station and stride.  Able to turn and tandem walk. Romberg negative. Tinel's sign at the wrist and elbow negative.  IMPRESSION: Left arm numbness, positional.  There is no obvious stenosis or nerve root compression on cervical CT to suggest radiculopathy.  She endorses left shoulder pain, but that may be a separate issue.  At this point, I favor a peripheral mononeuropathy.  Based on dermatome distribution of fingers, consider ulnar neuropathy.  However, carpal tunnel syndrome is still possible.  PLAN: 1.  NCV-EMG of left upper extremity.  Further recommendations pending results. 2.  In the meantime,  I advised her to wear a wrist splint on her left wrist at bedtime and as much as possible during the day to evaluate for any benefit.  Thank you for allowing me to take part in the care of this patient.  Metta Clines, DO  CC:  Emi Belfast, MD

## 2017-02-14 NOTE — Patient Instructions (Signed)
1.  Try wearing a wrist splint on your left wrist.  Wear it definitely at night while in bed.  Try wearing it during the day if possible.  I would be curious to see if it prevents the numbness 2.  Also we will schedule you for a nerve test in the left arm to test the nerves 3.  Follow up afterwards.  Further recommendations pending results.

## 2017-02-24 ENCOUNTER — Ambulatory Visit (INDEPENDENT_AMBULATORY_CARE_PROVIDER_SITE_OTHER): Payer: Federal, State, Local not specified - PPO | Admitting: Neurology

## 2017-02-24 DIAGNOSIS — R202 Paresthesia of skin: Secondary | ICD-10-CM

## 2017-02-24 DIAGNOSIS — R200 Anesthesia of skin: Secondary | ICD-10-CM

## 2017-02-24 DIAGNOSIS — G5602 Carpal tunnel syndrome, left upper limb: Secondary | ICD-10-CM

## 2017-02-24 NOTE — Procedures (Signed)
[facility]  [place], [place]  [place], [place] [address] Tel: [phone] Fax:  [phone] Test Date:  02/24/2017  Patient: [name] DOB: [birth year] Physician: Narda Amber, DO  Sex: Female Height: 5\' 4"  Ref Phys: Metta Clines, D.O.  ID#: [id number]   Technician:    Patient Complaints: This is a 61 year-old female referred for evaluation of left arm numbness.  NCV & EMG Findings: Extensive electrodiagnostic testing of the left upper extremity shows:  1. Left median sensory response shows prolonged distal peak latency (4.1 ms).  Left ulnar sensory response is within normal limits.  2. Left median and ulnar motor responses are within normal limits. 3. There is no evidence of active or chronic motor axon loss changes affecting any of the tested muscles. Motor unit configuration and recruitment pattern is within normal limits.  Impression: Left median neuropathy at or distal to the wrist, consistent with clinical diagnosis of carpal tunnel syndrome. Overall, these findings are mild in degree electrically.   ___________________________ Narda Amber, DO    Nerve Conduction Studies Anti Sensory Summary Table   Stim Site NR Peak (ms) Norm Peak (ms) P-T Amp (V) Norm P-T Amp  Left Median Anti Sensory (2nd Digit)  34C  Wrist    4.1 <3.6 32.3 >15  Left Ulnar Anti Sensory (5th Digit)  34C  Wrist    2.8 <3.1 45.8 >10   Motor Summary Table   Stim Site NR Onset (ms) Norm Onset (ms) O-P Amp (mV) Norm O-P Amp Site1 Site2 Delta-0 (ms) Dist (cm) Vel (m/s) Norm Vel (m/s)  Left Median Motor (Abd Poll Brev)  34C  Wrist    4.0 <4.0 10.5 >6 Elbow Wrist 5.0 28.0 56 >50  Elbow    9.0  10.0         Left Ulnar Motor (Abd Dig Minimi)  34C  Wrist    2.2 <3.1 9.2 >7 B Elbow Wrist 4.0 24.0 60 >50  B Elbow    6.2  8.6  A Elbow B Elbow 1.9 10.0 53 >50  A Elbow    8.1  8.1          EMG   Side Muscle Ins Act Fibs Psw Fasc Number Recrt Dur Dur. Amp Amp. Poly Poly. Comment    Left 1stDorInt Nml Nml Nml Nml Nml Nml Nml Nml Nml Nml Nml Nml N/A  Left Abd Poll Brev Nml Nml Nml Nml Nml Nml Nml Nml Nml Nml Nml Nml N/A  Left Ext Indicis Nml Nml Nml Nml Nml Nml Nml Nml Nml Nml Nml Nml N/A  Left PronatorTeres Nml Nml Nml Nml Nml Nml Nml Nml Nml Nml Nml Nml N/A  Left Biceps Nml Nml Nml Nml Nml Nml Nml Nml Nml Nml Nml Nml N/A  Left Triceps Nml Nml Nml Nml Nml Nml Nml Nml Nml Nml Nml Nml N/A  Left Deltoid Nml Nml Nml Nml Nml Nml Nml Nml Nml Nml Nml Nml N/A      Waveforms:

## 2017-02-25 ENCOUNTER — Telehealth: Payer: Self-pay

## 2017-02-25 ENCOUNTER — Telehealth: Payer: Self-pay | Admitting: *Deleted

## 2017-02-25 NOTE — Telephone Encounter (Signed)
Patient given results and instructions.  Requested for her to call me with an update in about 4 weeks if the splints aren't helping.

## 2017-02-25 NOTE — Telephone Encounter (Signed)
-----   Message from Pieter Partridge, DO sent at 02/25/2017  1:01 PM EDT ----- Nerve study shows evidence of mild carpal tunnel syndrome.  I would use the wrist splint as directed and see if that helps.  If ineffective, I would refer to hand specialist for potential steroid injection to see if it helps.

## 2017-02-25 NOTE — Telephone Encounter (Signed)
Left message for patient to call office to inform her of EMG results and recommendations.

## 2017-03-01 DIAGNOSIS — E039 Hypothyroidism, unspecified: Secondary | ICD-10-CM | POA: Diagnosis not present

## 2017-03-01 DIAGNOSIS — R748 Abnormal levels of other serum enzymes: Secondary | ICD-10-CM | POA: Diagnosis not present

## 2017-03-01 DIAGNOSIS — E7800 Pure hypercholesterolemia, unspecified: Secondary | ICD-10-CM | POA: Diagnosis not present

## 2017-03-01 DIAGNOSIS — G5602 Carpal tunnel syndrome, left upper limb: Secondary | ICD-10-CM | POA: Diagnosis not present

## 2017-03-01 DIAGNOSIS — Z0000 Encounter for general adult medical examination without abnormal findings: Secondary | ICD-10-CM | POA: Diagnosis not present

## 2017-03-01 DIAGNOSIS — E559 Vitamin D deficiency, unspecified: Secondary | ICD-10-CM | POA: Diagnosis not present

## 2017-03-08 DIAGNOSIS — K080 Exfoliation of teeth due to systemic causes: Secondary | ICD-10-CM | POA: Diagnosis not present

## 2017-03-23 DIAGNOSIS — H04123 Dry eye syndrome of bilateral lacrimal glands: Secondary | ICD-10-CM | POA: Diagnosis not present

## 2017-03-23 DIAGNOSIS — H2513 Age-related nuclear cataract, bilateral: Secondary | ICD-10-CM | POA: Diagnosis not present

## 2017-03-23 DIAGNOSIS — H10413 Chronic giant papillary conjunctivitis, bilateral: Secondary | ICD-10-CM | POA: Diagnosis not present

## 2017-05-19 DIAGNOSIS — Z1231 Encounter for screening mammogram for malignant neoplasm of breast: Secondary | ICD-10-CM | POA: Diagnosis not present

## 2017-05-26 DIAGNOSIS — H35372 Puckering of macula, left eye: Secondary | ICD-10-CM | POA: Diagnosis not present

## 2017-05-26 DIAGNOSIS — H2513 Age-related nuclear cataract, bilateral: Secondary | ICD-10-CM | POA: Diagnosis not present

## 2017-05-26 DIAGNOSIS — H33199 Other retinoschisis and retinal cysts, unspecified eye: Secondary | ICD-10-CM | POA: Diagnosis not present

## 2017-05-26 DIAGNOSIS — E119 Type 2 diabetes mellitus without complications: Secondary | ICD-10-CM | POA: Diagnosis not present

## 2017-07-14 DIAGNOSIS — J069 Acute upper respiratory infection, unspecified: Secondary | ICD-10-CM | POA: Diagnosis not present

## 2017-07-14 DIAGNOSIS — J0100 Acute maxillary sinusitis, unspecified: Secondary | ICD-10-CM | POA: Diagnosis not present

## 2017-08-29 DIAGNOSIS — M4003 Postural kyphosis, cervicothoracic region: Secondary | ICD-10-CM | POA: Diagnosis not present

## 2017-08-29 DIAGNOSIS — M25551 Pain in right hip: Secondary | ICD-10-CM | POA: Diagnosis not present

## 2017-08-29 DIAGNOSIS — M9901 Segmental and somatic dysfunction of cervical region: Secondary | ICD-10-CM | POA: Diagnosis not present

## 2017-08-29 DIAGNOSIS — M9905 Segmental and somatic dysfunction of pelvic region: Secondary | ICD-10-CM | POA: Diagnosis not present

## 2017-09-07 DIAGNOSIS — K080 Exfoliation of teeth due to systemic causes: Secondary | ICD-10-CM | POA: Diagnosis not present

## 2017-09-14 DIAGNOSIS — R7309 Other abnormal glucose: Secondary | ICD-10-CM | POA: Diagnosis not present

## 2017-09-20 DIAGNOSIS — I83813 Varicose veins of bilateral lower extremities with pain: Secondary | ICD-10-CM | POA: Diagnosis not present

## 2017-09-23 DIAGNOSIS — R7303 Prediabetes: Secondary | ICD-10-CM | POA: Diagnosis not present

## 2017-11-16 DIAGNOSIS — M40292 Other kyphosis, cervical region: Secondary | ICD-10-CM | POA: Diagnosis not present

## 2017-11-16 DIAGNOSIS — M9903 Segmental and somatic dysfunction of lumbar region: Secondary | ICD-10-CM | POA: Diagnosis not present

## 2017-11-16 DIAGNOSIS — M5136 Other intervertebral disc degeneration, lumbar region: Secondary | ICD-10-CM | POA: Diagnosis not present

## 2017-11-16 DIAGNOSIS — M9901 Segmental and somatic dysfunction of cervical region: Secondary | ICD-10-CM | POA: Diagnosis not present

## 2017-11-17 DIAGNOSIS — M9903 Segmental and somatic dysfunction of lumbar region: Secondary | ICD-10-CM | POA: Diagnosis not present

## 2017-11-17 DIAGNOSIS — M9901 Segmental and somatic dysfunction of cervical region: Secondary | ICD-10-CM | POA: Diagnosis not present

## 2017-11-17 DIAGNOSIS — M40292 Other kyphosis, cervical region: Secondary | ICD-10-CM | POA: Diagnosis not present

## 2017-11-17 DIAGNOSIS — M5136 Other intervertebral disc degeneration, lumbar region: Secondary | ICD-10-CM | POA: Diagnosis not present

## 2017-11-21 DIAGNOSIS — M5136 Other intervertebral disc degeneration, lumbar region: Secondary | ICD-10-CM | POA: Diagnosis not present

## 2017-11-21 DIAGNOSIS — M9903 Segmental and somatic dysfunction of lumbar region: Secondary | ICD-10-CM | POA: Diagnosis not present

## 2017-11-21 DIAGNOSIS — M40292 Other kyphosis, cervical region: Secondary | ICD-10-CM | POA: Diagnosis not present

## 2017-11-21 DIAGNOSIS — M9901 Segmental and somatic dysfunction of cervical region: Secondary | ICD-10-CM | POA: Diagnosis not present

## 2017-11-23 DIAGNOSIS — M40292 Other kyphosis, cervical region: Secondary | ICD-10-CM | POA: Diagnosis not present

## 2017-11-23 DIAGNOSIS — M5136 Other intervertebral disc degeneration, lumbar region: Secondary | ICD-10-CM | POA: Diagnosis not present

## 2017-11-23 DIAGNOSIS — M9901 Segmental and somatic dysfunction of cervical region: Secondary | ICD-10-CM | POA: Diagnosis not present

## 2017-11-23 DIAGNOSIS — M9903 Segmental and somatic dysfunction of lumbar region: Secondary | ICD-10-CM | POA: Diagnosis not present

## 2017-11-25 DIAGNOSIS — M40292 Other kyphosis, cervical region: Secondary | ICD-10-CM | POA: Diagnosis not present

## 2017-11-25 DIAGNOSIS — M9901 Segmental and somatic dysfunction of cervical region: Secondary | ICD-10-CM | POA: Diagnosis not present

## 2017-11-25 DIAGNOSIS — M9903 Segmental and somatic dysfunction of lumbar region: Secondary | ICD-10-CM | POA: Diagnosis not present

## 2017-11-25 DIAGNOSIS — M5136 Other intervertebral disc degeneration, lumbar region: Secondary | ICD-10-CM | POA: Diagnosis not present

## 2017-11-28 DIAGNOSIS — M40292 Other kyphosis, cervical region: Secondary | ICD-10-CM | POA: Diagnosis not present

## 2017-11-28 DIAGNOSIS — M5136 Other intervertebral disc degeneration, lumbar region: Secondary | ICD-10-CM | POA: Diagnosis not present

## 2017-11-28 DIAGNOSIS — M9903 Segmental and somatic dysfunction of lumbar region: Secondary | ICD-10-CM | POA: Diagnosis not present

## 2017-11-28 DIAGNOSIS — M9901 Segmental and somatic dysfunction of cervical region: Secondary | ICD-10-CM | POA: Diagnosis not present

## 2017-11-29 DIAGNOSIS — M5136 Other intervertebral disc degeneration, lumbar region: Secondary | ICD-10-CM | POA: Diagnosis not present

## 2017-11-29 DIAGNOSIS — M40292 Other kyphosis, cervical region: Secondary | ICD-10-CM | POA: Diagnosis not present

## 2017-11-29 DIAGNOSIS — M9901 Segmental and somatic dysfunction of cervical region: Secondary | ICD-10-CM | POA: Diagnosis not present

## 2017-11-29 DIAGNOSIS — M9903 Segmental and somatic dysfunction of lumbar region: Secondary | ICD-10-CM | POA: Diagnosis not present

## 2017-12-02 DIAGNOSIS — M9903 Segmental and somatic dysfunction of lumbar region: Secondary | ICD-10-CM | POA: Diagnosis not present

## 2017-12-02 DIAGNOSIS — M9901 Segmental and somatic dysfunction of cervical region: Secondary | ICD-10-CM | POA: Diagnosis not present

## 2017-12-02 DIAGNOSIS — M5136 Other intervertebral disc degeneration, lumbar region: Secondary | ICD-10-CM | POA: Diagnosis not present

## 2017-12-02 DIAGNOSIS — M40292 Other kyphosis, cervical region: Secondary | ICD-10-CM | POA: Diagnosis not present

## 2017-12-06 DIAGNOSIS — M9901 Segmental and somatic dysfunction of cervical region: Secondary | ICD-10-CM | POA: Diagnosis not present

## 2017-12-06 DIAGNOSIS — M40292 Other kyphosis, cervical region: Secondary | ICD-10-CM | POA: Diagnosis not present

## 2017-12-06 DIAGNOSIS — M5136 Other intervertebral disc degeneration, lumbar region: Secondary | ICD-10-CM | POA: Diagnosis not present

## 2017-12-06 DIAGNOSIS — M9903 Segmental and somatic dysfunction of lumbar region: Secondary | ICD-10-CM | POA: Diagnosis not present

## 2017-12-08 DIAGNOSIS — M40292 Other kyphosis, cervical region: Secondary | ICD-10-CM | POA: Diagnosis not present

## 2017-12-08 DIAGNOSIS — M9903 Segmental and somatic dysfunction of lumbar region: Secondary | ICD-10-CM | POA: Diagnosis not present

## 2017-12-08 DIAGNOSIS — M5136 Other intervertebral disc degeneration, lumbar region: Secondary | ICD-10-CM | POA: Diagnosis not present

## 2017-12-08 DIAGNOSIS — M9901 Segmental and somatic dysfunction of cervical region: Secondary | ICD-10-CM | POA: Diagnosis not present

## 2017-12-15 DIAGNOSIS — M9903 Segmental and somatic dysfunction of lumbar region: Secondary | ICD-10-CM | POA: Diagnosis not present

## 2017-12-15 DIAGNOSIS — M9901 Segmental and somatic dysfunction of cervical region: Secondary | ICD-10-CM | POA: Diagnosis not present

## 2017-12-15 DIAGNOSIS — M5136 Other intervertebral disc degeneration, lumbar region: Secondary | ICD-10-CM | POA: Diagnosis not present

## 2017-12-15 DIAGNOSIS — M40292 Other kyphosis, cervical region: Secondary | ICD-10-CM | POA: Diagnosis not present

## 2017-12-22 DIAGNOSIS — M5136 Other intervertebral disc degeneration, lumbar region: Secondary | ICD-10-CM | POA: Diagnosis not present

## 2017-12-22 DIAGNOSIS — M40292 Other kyphosis, cervical region: Secondary | ICD-10-CM | POA: Diagnosis not present

## 2017-12-22 DIAGNOSIS — M9901 Segmental and somatic dysfunction of cervical region: Secondary | ICD-10-CM | POA: Diagnosis not present

## 2017-12-22 DIAGNOSIS — M9903 Segmental and somatic dysfunction of lumbar region: Secondary | ICD-10-CM | POA: Diagnosis not present

## 2017-12-26 DIAGNOSIS — M9903 Segmental and somatic dysfunction of lumbar region: Secondary | ICD-10-CM | POA: Diagnosis not present

## 2017-12-26 DIAGNOSIS — M9901 Segmental and somatic dysfunction of cervical region: Secondary | ICD-10-CM | POA: Diagnosis not present

## 2017-12-26 DIAGNOSIS — M5136 Other intervertebral disc degeneration, lumbar region: Secondary | ICD-10-CM | POA: Diagnosis not present

## 2017-12-26 DIAGNOSIS — M40292 Other kyphosis, cervical region: Secondary | ICD-10-CM | POA: Diagnosis not present

## 2018-01-02 DIAGNOSIS — M40292 Other kyphosis, cervical region: Secondary | ICD-10-CM | POA: Diagnosis not present

## 2018-01-02 DIAGNOSIS — M9901 Segmental and somatic dysfunction of cervical region: Secondary | ICD-10-CM | POA: Diagnosis not present

## 2018-01-02 DIAGNOSIS — M5136 Other intervertebral disc degeneration, lumbar region: Secondary | ICD-10-CM | POA: Diagnosis not present

## 2018-01-02 DIAGNOSIS — M9903 Segmental and somatic dysfunction of lumbar region: Secondary | ICD-10-CM | POA: Diagnosis not present

## 2018-01-09 DIAGNOSIS — M5136 Other intervertebral disc degeneration, lumbar region: Secondary | ICD-10-CM | POA: Diagnosis not present

## 2018-01-09 DIAGNOSIS — M40292 Other kyphosis, cervical region: Secondary | ICD-10-CM | POA: Diagnosis not present

## 2018-01-09 DIAGNOSIS — M9903 Segmental and somatic dysfunction of lumbar region: Secondary | ICD-10-CM | POA: Diagnosis not present

## 2018-01-09 DIAGNOSIS — M9901 Segmental and somatic dysfunction of cervical region: Secondary | ICD-10-CM | POA: Diagnosis not present

## 2018-01-16 DIAGNOSIS — M9901 Segmental and somatic dysfunction of cervical region: Secondary | ICD-10-CM | POA: Diagnosis not present

## 2018-01-16 DIAGNOSIS — M5136 Other intervertebral disc degeneration, lumbar region: Secondary | ICD-10-CM | POA: Diagnosis not present

## 2018-01-16 DIAGNOSIS — M9903 Segmental and somatic dysfunction of lumbar region: Secondary | ICD-10-CM | POA: Diagnosis not present

## 2018-01-16 DIAGNOSIS — M40292 Other kyphosis, cervical region: Secondary | ICD-10-CM | POA: Diagnosis not present

## 2018-02-06 DIAGNOSIS — M40292 Other kyphosis, cervical region: Secondary | ICD-10-CM | POA: Diagnosis not present

## 2018-02-06 DIAGNOSIS — M9901 Segmental and somatic dysfunction of cervical region: Secondary | ICD-10-CM | POA: Diagnosis not present

## 2018-02-06 DIAGNOSIS — M9903 Segmental and somatic dysfunction of lumbar region: Secondary | ICD-10-CM | POA: Diagnosis not present

## 2018-02-06 DIAGNOSIS — M5136 Other intervertebral disc degeneration, lumbar region: Secondary | ICD-10-CM | POA: Diagnosis not present

## 2018-03-28 DIAGNOSIS — K080 Exfoliation of teeth due to systemic causes: Secondary | ICD-10-CM | POA: Diagnosis not present

## 2018-05-19 DIAGNOSIS — K219 Gastro-esophageal reflux disease without esophagitis: Secondary | ICD-10-CM | POA: Diagnosis not present

## 2018-05-19 DIAGNOSIS — R7303 Prediabetes: Secondary | ICD-10-CM | POA: Diagnosis not present

## 2018-05-19 DIAGNOSIS — E559 Vitamin D deficiency, unspecified: Secondary | ICD-10-CM | POA: Diagnosis not present

## 2018-05-19 DIAGNOSIS — N841 Polyp of cervix uteri: Secondary | ICD-10-CM | POA: Diagnosis not present

## 2018-05-19 DIAGNOSIS — Z0000 Encounter for general adult medical examination without abnormal findings: Secondary | ICD-10-CM | POA: Diagnosis not present

## 2018-05-19 DIAGNOSIS — E7800 Pure hypercholesterolemia, unspecified: Secondary | ICD-10-CM | POA: Diagnosis not present

## 2018-05-23 DIAGNOSIS — Z1231 Encounter for screening mammogram for malignant neoplasm of breast: Secondary | ICD-10-CM | POA: Diagnosis not present

## 2018-05-26 DIAGNOSIS — R928 Other abnormal and inconclusive findings on diagnostic imaging of breast: Secondary | ICD-10-CM | POA: Diagnosis not present

## 2018-06-28 DIAGNOSIS — E119 Type 2 diabetes mellitus without complications: Secondary | ICD-10-CM | POA: Diagnosis not present

## 2018-06-28 DIAGNOSIS — H2513 Age-related nuclear cataract, bilateral: Secondary | ICD-10-CM | POA: Diagnosis not present

## 2018-06-28 DIAGNOSIS — H33192 Other retinoschisis and retinal cysts, left eye: Secondary | ICD-10-CM | POA: Diagnosis not present

## 2018-06-28 DIAGNOSIS — H35372 Puckering of macula, left eye: Secondary | ICD-10-CM | POA: Diagnosis not present

## 2018-07-13 DIAGNOSIS — R05 Cough: Secondary | ICD-10-CM | POA: Diagnosis not present

## 2018-07-13 DIAGNOSIS — R7303 Prediabetes: Secondary | ICD-10-CM | POA: Diagnosis not present

## 2018-07-13 DIAGNOSIS — K219 Gastro-esophageal reflux disease without esophagitis: Secondary | ICD-10-CM | POA: Diagnosis not present

## 2018-07-13 DIAGNOSIS — J209 Acute bronchitis, unspecified: Secondary | ICD-10-CM | POA: Diagnosis not present

## 2018-07-19 ENCOUNTER — Other Ambulatory Visit: Payer: Self-pay | Admitting: Internal Medicine

## 2018-07-19 DIAGNOSIS — J329 Chronic sinusitis, unspecified: Secondary | ICD-10-CM

## 2018-07-19 DIAGNOSIS — J320 Chronic maxillary sinusitis: Secondary | ICD-10-CM

## 2018-07-19 DIAGNOSIS — R0981 Nasal congestion: Secondary | ICD-10-CM | POA: Diagnosis not present

## 2018-07-25 ENCOUNTER — Other Ambulatory Visit: Payer: Federal, State, Local not specified - PPO

## 2018-07-25 ENCOUNTER — Ambulatory Visit
Admission: RE | Admit: 2018-07-25 | Discharge: 2018-07-25 | Disposition: A | Discharge status: Home / Self Care | Payer: Federal, State, Local not specified - PPO | Source: Ambulatory Visit | Attending: Internal Medicine | Admitting: Internal Medicine

## 2018-07-25 DIAGNOSIS — J324 Chronic pansinusitis: Secondary | ICD-10-CM | POA: Diagnosis not present

## 2018-07-25 DIAGNOSIS — J320 Chronic maxillary sinusitis: Secondary | ICD-10-CM

## 2018-07-27 DIAGNOSIS — J0140 Acute pansinusitis, unspecified: Secondary | ICD-10-CM | POA: Diagnosis not present

## 2018-07-27 DIAGNOSIS — K521 Toxic gastroenteritis and colitis: Secondary | ICD-10-CM | POA: Diagnosis not present

## 2018-07-31 DIAGNOSIS — J329 Chronic sinusitis, unspecified: Secondary | ICD-10-CM | POA: Diagnosis not present

## 2018-08-14 DIAGNOSIS — J329 Chronic sinusitis, unspecified: Secondary | ICD-10-CM | POA: Diagnosis not present

## 2018-08-22 DIAGNOSIS — K219 Gastro-esophageal reflux disease without esophagitis: Secondary | ICD-10-CM | POA: Diagnosis not present

## 2018-08-22 DIAGNOSIS — Z86010 Personal history of colonic polyps: Secondary | ICD-10-CM | POA: Diagnosis not present

## 2018-08-22 DIAGNOSIS — Z1211 Encounter for screening for malignant neoplasm of colon: Secondary | ICD-10-CM | POA: Diagnosis not present

## 2018-08-22 DIAGNOSIS — R1310 Dysphagia, unspecified: Secondary | ICD-10-CM | POA: Diagnosis not present

## 2018-09-06 DIAGNOSIS — L723 Sebaceous cyst: Secondary | ICD-10-CM | POA: Diagnosis not present

## 2018-09-06 DIAGNOSIS — L292 Pruritus vulvae: Secondary | ICD-10-CM | POA: Diagnosis not present

## 2018-09-25 DIAGNOSIS — K5730 Diverticulosis of large intestine without perforation or abscess without bleeding: Secondary | ICD-10-CM | POA: Diagnosis not present

## 2018-09-25 DIAGNOSIS — R1310 Dysphagia, unspecified: Secondary | ICD-10-CM | POA: Diagnosis not present

## 2018-09-25 DIAGNOSIS — K219 Gastro-esophageal reflux disease without esophagitis: Secondary | ICD-10-CM | POA: Diagnosis not present

## 2018-09-25 DIAGNOSIS — K317 Polyp of stomach and duodenum: Secondary | ICD-10-CM | POA: Diagnosis not present

## 2018-09-25 DIAGNOSIS — Z1211 Encounter for screening for malignant neoplasm of colon: Secondary | ICD-10-CM | POA: Diagnosis not present

## 2018-10-26 DIAGNOSIS — T753XXA Motion sickness, initial encounter: Secondary | ICD-10-CM | POA: Diagnosis not present

## 2018-11-14 DIAGNOSIS — B9789 Other viral agents as the cause of diseases classified elsewhere: Secondary | ICD-10-CM | POA: Diagnosis not present

## 2018-11-14 DIAGNOSIS — J069 Acute upper respiratory infection, unspecified: Secondary | ICD-10-CM | POA: Diagnosis not present

## 2018-11-20 ENCOUNTER — Encounter: Payer: Self-pay | Admitting: Emergency Medicine

## 2018-11-20 ENCOUNTER — Ambulatory Visit
Admission: EM | Admit: 2018-11-20 | Discharge: 2018-11-20 | Disposition: A | Discharge status: Home / Self Care | Payer: Federal, State, Local not specified - PPO | Attending: Family Medicine | Admitting: Family Medicine

## 2018-11-20 DIAGNOSIS — J019 Acute sinusitis, unspecified: Secondary | ICD-10-CM | POA: Diagnosis not present

## 2018-11-20 MED ORDER — PSEUDOEPH-BROMPHEN-DM 30-2-10 MG/5ML PO SYRP: 5.0000 mL | ORAL_SOLUTION | Freq: Four times a day (QID) | ORAL | 0 refills | Status: DC | PRN

## 2018-11-20 MED ORDER — CETIRIZINE HCL 10 MG PO CAPS: 10.0000 mg | ORAL_CAPSULE | Freq: Every day | ORAL | 0 refills | Status: DC

## 2018-11-20 MED ORDER — DOXYCYCLINE HYCLATE 100 MG PO CAPS: 100.0000 mg | ORAL_CAPSULE | Freq: Two times a day (BID) | ORAL | 0 refills | Status: AC

## 2018-11-20 NOTE — Discharge Instructions (Signed)
Please begin taking doxycycline twice a day for the next 10 days Begin daily cetirizine/Zyrtec or Claritin, may get over-the-counter if it is cheaper for you May use cough syrup provided which has a little Sudafed to help with congestion, or you may use Mucinex, Delsym Drink plenty of fluids Continue to rest  Follow-up if symptoms not resolving, worsening developing fever, shortness of breath or worsening chest discomfort

## 2018-11-20 NOTE — ED Triage Notes (Signed)
Pt presents to [facility] for assessment of back pain, chest congestion, sniffles, cough with phlegm, diarrhea that started today.

## 2018-11-20 NOTE — ED Provider Notes (Signed)
[name]    CSN: [id number] Arrival date & time: 11/20/18  1721     History   Chief Complaint Chief Complaint  Patient presents with  . Flu-Like Symptoms    HPI [name] is a 63 y.o. female history of osteopenia, hyperlipidemia presenting today for evaluation of URI symptoms.  Patient states that she has had cough, nasal congestion.  Symptoms initially began with sore throat and began last Sunday.  She was seen by her PCP on Wednesday and treated for viral URI.  Symptoms have persisted and she has not seen any improvement still.  She feels her symptoms are both in her sinuses as well as her chest.  Denies any shortness of breath or chest discomfort.  She has had one episode of diarrhea today, some intermittent nausea, but no vomiting.  Denies any fevers.  She has tried over-the-counter Tylenol and Mucinex with minimal relief.  HPI  Past Medical History:  Diagnosis Date  . Colon polyps   . GERD (gastroesophageal reflux disease)   . Goiter   . Hyperlipidemia   . Leukopenia   . Liver hemangioma   . Osteopenia   . Thrombocytopenia ([place])   . Vitamin D deficiency     Patient Active Problem List   Diagnosis Date Noted  . 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 GERD (gastroesophageal reflux disease) 12/05/2013  . Colon polyps 09/05/2013  . Adjustment disorder with mixed anxiety and depressed mood 01/09/2013  . Family history of cervical cancer 04/12/2012  . Hypothyroidism 03/21/2012  . Goiter 03/21/2012  . Hyperlipidemia 03/21/2012  . Menopause 03/21/2012  . Positive ANA (antinuclear antibody) 03/21/2012  . Leukopenia 03/21/2012  . Thrombocytopenia ([place]) 03/21/2012  . Hepatic lesion 03/21/2012  . Vitamin D deficiency 03/21/2012  . Hx of adenomatous colonic polyps 03/21/2012  . Osteopenia 04/26/2011    Past Surgical History:  Procedure Laterality Date  . MOUTH SURGERY    . THYROIDECTOMY, PARTIAL  04/2009    OB History    Gravida  2     Para  2   Term      Preterm      AB      Living        SAB      TAB      Ectopic      Multiple      Live Births               Home Medications    Prior to Admission medications   Medication Sig Start Date End Date Taking? Authorizing Provider  Calcium Carbonate-Vitamin D (CALCIUM + D PO) Take 1 tablet by mouth daily.   Yes [provider]  levothyroxine (SYNTHROID, LEVOTHROID) 50 MCG tablet TAKE 1&1/2 TABLETS BY MOUTH ON MONDAY, WEDNESDAY, AND FRIDAY AND TAKE 1 TABLET BY MOUTH ON TUESDAY, THURSDAY, [place], AND SUNDAY 06/25/16  Yes Pleas Koch, NP  simvastatin (ZOCOR) 10 MG tablet Take 1 tablet (10 mg total) by mouth at bedtime. 06/06/15  Yes Pleas Koch, NP  brompheniramine-pseudoephedrine-DM 30-2-10 MG/5ML syrup Take 5 mLs by mouth 4 (four) times daily as needed. 11/20/18   Udell Mazzocco C, PA-C  Cetirizine HCl 10 MG CAPS Take 1 capsule (10 mg total) by mouth daily for 10 days. 11/20/18 11/30/18  Scotty Pinder C, PA-C  doxycycline (VIBRAMYCIN) 100 MG capsule Take 1 capsule (100 mg total) by mouth 2 (two) times daily for 10 days. 11/20/18 11/30/18  Terianna Peggs, Elesa Hacker, PA-C  Elastic Bandages & Supports (WRIST SPLINT/COCK-UP/LEFT M) MISC 1 Device by Does not apply route daily. 02/14/17   Pieter Partridge, DO  levothyroxine (SYNTHROID, LEVOTHROID) 50 MCG tablet TAKE 1&1/2 TABLETS BY MOUTH ON MONDAY, WEDNESDAY, AND FRIDAY AND TAKE 1 TABLET BY MOUTH ON TUESDAY, THURSDAY, SATURDAY, AND SUNDAY 12/22/16   Pleas Koch, NP  LORazepam (ATIVAN) 1 MG tablet Take 1/2 tablet as needed every 12 hours.  May repeat in 30 mins if needed 12/05/13   Schoenhoff, Altamese Cabal, MD  Vitamin D, Ergocalciferol, (DRISDOL) 50000 UNITS CAPS capsule Take 1 capsule by mouth once weekly for a total of 12 weeks. 08/14/15   Pleas Koch, NP    Family History Family History  Problem Relation Age of Onset  . Cervical cancer Mother   . Diabetes Father   . Cervical cancer Sister   .  Cancer Brother        brain  . Diabetes Brother   . Arthritis Sister        rheumatoid    Social History Social History   Tobacco Use  . Smoking status: Never Smoker  . Smokeless tobacco: Never Used  Substance Use Topics  . Alcohol use: No    Alcohol/week: 0.0 standard drinks  . Drug use: No     Allergies   Azithromycin; Codeine; and Lactose intolerance (gi)   Review of Systems Review of Systems  Constitutional: Negative for activity change, appetite change, chills, fatigue and fever.  HENT: Positive for congestion, rhinorrhea and sinus pressure. Negative for ear pain, sore throat and trouble swallowing.   Eyes: Negative for discharge and redness.  Respiratory: Positive for cough. Negative for chest tightness and shortness of breath.   Cardiovascular: Negative for chest pain.  Gastrointestinal: Negative for abdominal pain, diarrhea, nausea and vomiting.  Musculoskeletal: Negative for myalgias.  Skin: Negative for rash.  Neurological: Negative for dizziness, light-headedness and headaches.     Physical Exam Triage Vital Signs ED Triage Vitals [11/20/18 1735]  Enc Vitals Group     BP 116/71     Pulse Rate 79     Resp 18     Temp 97.8 F (36.6 C)     Temp Source Oral     SpO2 96 %     Weight      Height      Head Circumference      Peak Flow      Pain Score 3     Pain Loc      Pain Edu?      Excl. in [place]?    No data found.  Updated Vital Signs BP 116/71 (BP Location: Left Arm)   Pulse 79   Temp 97.8 F (36.6 C) (Oral)   Resp 18   SpO2 96%   Visual Acuity Right Eye Distance:   Left Eye Distance:   Bilateral Distance:    Right Eye Near:   Left Eye Near:    Bilateral Near:     Physical Exam Vitals signs and nursing note reviewed.  Constitutional:      General: She is not in acute distress.    Appearance: She is well-developed.  HENT:     Head: Normocephalic and atraumatic.     Ears:     Comments: Bilateral ears without tenderness to  palpation of external auricle, tragus and mastoid, EAC's without erythema or swelling, TM's with good bony landmarks and cone of light. Non erythematous.    Nose:  Comments: Nasal mucosa mildly erythematous, mildly swollen turbinates, no rhinorrhea present    Mouth/Throat:     Comments: Oral mucosa pink and moist, no tonsillar enlargement or exudate. Posterior pharynx patent and nonerythematous, no uvula deviation or swelling. Normal phonation. Eyes:     Conjunctiva/sclera: Conjunctivae normal.  Neck:     Musculoskeletal: Neck supple.  Cardiovascular:     Rate and Rhythm: Normal rate and regular rhythm.     Heart sounds: No murmur.  Pulmonary:     Effort: Pulmonary effort is normal. No respiratory distress.     Breath sounds: Normal breath sounds.     Comments: Breathing comfortably at rest, CTABL, no wheezing, rales or other adventitious sounds auscultated Abdominal:     Palpations: Abdomen is soft.     Tenderness: There is no abdominal tenderness.  Skin:    General: Skin is warm and dry.  Neurological:     Mental Status: She is alert.      UC Treatments / Results  Labs (all labs ordered are listed, but only abnormal results are displayed) Labs Reviewed - No data to display  EKG None  Radiology No results found.  Procedures Procedures (including critical care time)  Medications Ordered in UC Medications - No data to display  Initial Impression / Assessment and Plan / UC Course  I have reviewed the triage vital signs and the nursing notes.  Pertinent labs & imaging results that were available during my care of the patient were reviewed by me and considered in my medical decision making (see chart for details).     URI symptoms greater than 1 week, will treat patient for sinusitis given no improvement over the past week.  Provided doxycycline as she states that she has tolerated this well previously, as well as to cover atypical respiratory illness.  Will  recommend further symptomatic management with initiating daily allergy/antihistamine.  Cough syrup as needed.  Push fluids.Discussed strict return precautions. Patient verbalized understanding and is agreeable with plan.  Final Clinical Impressions(s) / UC Diagnoses   Final diagnoses:  Acute sinusitis with symptoms > 10 days     Discharge Instructions     Please begin taking doxycycline twice a day for the next 10 days Begin daily cetirizine/Zyrtec or Claritin, may get over-the-counter if it is cheaper for you May use cough syrup provided which has a little Sudafed to help with congestion, or you may use Mucinex, Delsym Drink plenty of fluids Continue to rest  Follow-up if symptoms not resolving, worsening developing fever, shortness of breath or worsening chest discomfort   ED Prescriptions    Medication Sig Dispense Auth. Provider   doxycycline (VIBRAMYCIN) 100 MG capsule Take 1 capsule (100 mg total) by mouth 2 (two) times daily for 10 days. 20 capsule Denali Becvar C, PA-C   brompheniramine-pseudoephedrine-DM 30-2-10 MG/5ML syrup Take 5 mLs by mouth 4 (four) times daily as needed. 120 mL Zyniah Ferraiolo C, PA-C   Cetirizine HCl 10 MG CAPS Take 1 capsule (10 mg total) by mouth daily for 10 days. 10 capsule Zyquan Crotty C, PA-C     Controlled Substance Prescriptions [place] Controlled Substance Registry consulted? Not Applicable   Janith Lima, [place] 11/20/18 1758

## 2018-11-22 DIAGNOSIS — Z6826 Body mass index (BMI) 26.0-26.9, adult: Secondary | ICD-10-CM | POA: Diagnosis not present

## 2018-11-22 DIAGNOSIS — Z01419 Encounter for gynecological examination (general) (routine) without abnormal findings: Secondary | ICD-10-CM | POA: Diagnosis not present

## 2018-11-22 DIAGNOSIS — Z1151 Encounter for screening for human papillomavirus (HPV): Secondary | ICD-10-CM | POA: Diagnosis not present

## 2019-02-01 DIAGNOSIS — K601 Chronic anal fissure: Secondary | ICD-10-CM | POA: Diagnosis not present

## 2019-02-01 DIAGNOSIS — K5900 Constipation, unspecified: Secondary | ICD-10-CM | POA: Diagnosis not present

## 2019-02-01 DIAGNOSIS — K219 Gastro-esophageal reflux disease without esophagitis: Secondary | ICD-10-CM | POA: Diagnosis not present

## 2019-02-01 DIAGNOSIS — K6289 Other specified diseases of anus and rectum: Secondary | ICD-10-CM | POA: Diagnosis not present

## 2019-06-07 DIAGNOSIS — Z1231 Encounter for screening mammogram for malignant neoplasm of breast: Secondary | ICD-10-CM | POA: Diagnosis not present

## 2019-06-11 ENCOUNTER — Ambulatory Visit
Admission: EM | Admit: 2019-06-11 | Discharge: 2019-06-11 | Disposition: A | Discharge status: Home / Self Care | Payer: Federal, State, Local not specified - PPO | Attending: Emergency Medicine | Admitting: Emergency Medicine

## 2019-06-11 ENCOUNTER — Other Ambulatory Visit: Payer: Self-pay

## 2019-06-11 ENCOUNTER — Encounter: Payer: Self-pay | Admitting: Emergency Medicine

## 2019-06-11 DIAGNOSIS — B9689 Other specified bacterial agents as the cause of diseases classified elsewhere: Secondary | ICD-10-CM | POA: Diagnosis not present

## 2019-06-11 DIAGNOSIS — N3001 Acute cystitis with hematuria: Secondary | ICD-10-CM | POA: Insufficient documentation

## 2019-06-11 LAB — POCT URINALYSIS DIP (MANUAL ENTRY)
Bilirubin, UA: NEGATIVE
Glucose, UA: NEGATIVE mg/dL
Ketones, POC UA: NEGATIVE mg/dL
Nitrite, UA: NEGATIVE
Protein Ur, POC: NEGATIVE mg/dL
Spec Grav, UA: 1.01 (ref 1.010–1.025)
Urobilinogen, UA: 0.2 E.U./dL
pH, UA: 7 (ref 5.0–8.0)

## 2019-06-11 MED ORDER — CEPHALEXIN 500 MG PO CAPS: 500.0000 mg | ORAL_CAPSULE | Freq: Two times a day (BID) | ORAL | 0 refills | Status: AC

## 2019-06-11 NOTE — ED Notes (Signed)
Patient able to ambulate independently  

## 2019-06-11 NOTE — ED Provider Notes (Signed)
[name]    CSN: [id number] Arrival date & time: 06/11/19  1646      History   Chief Complaint Chief Complaint  Patient presents with  . Dysuria    HPI [name] is a 63 y.o. female presenting for lower abdominal, back pain with malodorous urine for the last week.  Patient has noticed intermittent nausea without vomiting.  Has not take anything for this.  Patient has a remote history of UTI, though denies history of pyelonephritis, renal calculi.  Past Medical History:  Diagnosis Date  . Colon polyps   . GERD (gastroesophageal reflux disease)   . Goiter   . Hyperlipidemia   . Leukopenia   . Liver hemangioma   . Osteopenia   . Thrombocytopenia ([place])   . Vitamin D deficiency     Patient Active Problem List   Diagnosis Date Noted  . 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 GERD (gastroesophageal reflux disease) 12/05/2013  . Colon polyps 09/05/2013  . Adjustment disorder with mixed anxiety and depressed mood 01/09/2013  . Family history of cervical cancer 04/12/2012  . Hypothyroidism 03/21/2012  . Goiter 03/21/2012  . Hyperlipidemia 03/21/2012  . Menopause 03/21/2012  . Positive ANA (antinuclear antibody) 03/21/2012  . Leukopenia 03/21/2012  . Thrombocytopenia ([place]) 03/21/2012  . Hepatic lesion 03/21/2012  . Vitamin D deficiency 03/21/2012  . Hx of adenomatous colonic polyps 03/21/2012  . Osteopenia 04/26/2011    Past Surgical History:  Procedure Laterality Date  . MOUTH SURGERY    . THYROIDECTOMY, PARTIAL  04/2009    OB History    Gravida  2   Para  2   Term      Preterm      AB      Living        SAB      TAB      Ectopic      Multiple      Live Births               Home Medications    Prior to Admission medications   Medication Sig Start Date End Date Taking? Authorizing Provider  Calcium Carbonate-Vitamin D (CALCIUM + D PO) Take 1 tablet by mouth daily.    [provider]  cephALEXin  (KEFLEX) 500 MG capsule Take 1 capsule (500 mg total) by mouth 2 (two) times daily for 3 days. 06/11/19 06/14/19  Hall-Potvin, [place], PA-C  Cetirizine HCl 10 MG CAPS Take 1 capsule (10 mg total) by mouth daily for 10 days. 11/20/18 11/30/18  Wieters, Elesa Hacker, PA-C  Elastic Bandages & Supports (WRIST SPLINT/COCK-UP/LEFT M) MISC 1 Device by Does not apply route daily. 02/14/17   Pieter Partridge, DO  levothyroxine (SYNTHROID, LEVOTHROID) 50 MCG tablet TAKE 1&1/2 TABLETS BY MOUTH ON MONDAY, WEDNESDAY, AND FRIDAY AND TAKE 1 TABLET BY MOUTH ON TUESDAY, THURSDAY, SATURDAY, AND SUNDAY 06/25/16   Pleas Koch, NP  levothyroxine (SYNTHROID, LEVOTHROID) 50 MCG tablet TAKE 1&1/2 TABLETS BY MOUTH ON MONDAY, WEDNESDAY, AND FRIDAY AND TAKE 1 TABLET BY MOUTH ON TUESDAY, THURSDAY, SATURDAY, AND SUNDAY 12/22/16   Pleas Koch, NP  LORazepam (ATIVAN) 1 MG tablet Take 1/2 tablet as needed every 12 hours.  May repeat in 30 mins if needed 12/05/13   Schoenhoff, Altamese Cabal, MD  simvastatin (ZOCOR) 10 MG tablet Take 1 tablet (10 mg total) by mouth at bedtime. 06/06/15   Pleas Koch, NP  Vitamin D, Ergocalciferol, (DRISDOL) 50000 UNITS  CAPS capsule Take 1 capsule by mouth once weekly for a total of 12 weeks. 08/14/15   Pleas Koch, NP    Family History Family History  Problem Relation Age of Onset  . Cervical cancer Mother   . Diabetes Father   . Cervical cancer Sister   . Cancer Brother        brain  . Diabetes Brother   . Arthritis Sister        rheumatoid    Social History Social History   Tobacco Use  . Smoking status: Never Smoker  . Smokeless tobacco: Never Used  Substance Use Topics  . Alcohol use: No    Alcohol/week: 0.0 standard drinks  . Drug use: No     Allergies   Azithromycin, Codeine, and Lactose intolerance (gi)   Review of Systems Review of Systems  Constitutional: Negative for fatigue and fever.  Respiratory: Negative for cough and shortness of breath.    Cardiovascular: Negative for chest pain and palpitations.  Gastrointestinal: Positive for abdominal pain. Negative for constipation and diarrhea.  Genitourinary: Positive for dysuria. Negative for flank pain, frequency, hematuria, pelvic pain, urgency, vaginal bleeding, vaginal discharge and vaginal pain.  Musculoskeletal: Positive for back pain.     Physical Exam Triage Vital Signs ED Triage Vitals  Enc Vitals Group     BP 06/11/19 1659 116/74     Pulse Rate 06/11/19 1659 (!) 54     Resp 06/11/19 1659 18     Temp 06/11/19 1659 (!) 97.4 F (36.3 C)     Temp Source 06/11/19 1659 Oral     SpO2 06/11/19 1659 98 %     Weight --      Height --      Head Circumference --      Peak Flow --      Pain Score 06/11/19 1700 5     Pain Loc --      Pain Edu? --      Excl. in [place]? --    No data found.  Updated Vital Signs BP 116/74 (BP Location: Left Arm)   Pulse (!) 54   Temp (!) 97.4 F (36.3 C) (Oral)   Resp 18   SpO2 98%   Visual Acuity Right Eye Distance:   Left Eye Distance:   Bilateral Distance:    Right Eye Near:   Left Eye Near:    Bilateral Near:     Physical Exam Constitutional:      General: She is not in acute distress. HENT:     Head: Normocephalic and atraumatic.  Eyes:     General: No scleral icterus.    Pupils: Pupils are equal, round, and reactive to light.  Cardiovascular:     Rate and Rhythm: Normal rate.  Pulmonary:     Effort: Pulmonary effort is normal.  Abdominal:     General: Bowel sounds are normal.     Palpations: Abdomen is soft.     Tenderness: There is no abdominal tenderness. There is no right CVA tenderness, left CVA tenderness or guarding.  Skin:    Coloration: Skin is not jaundiced or pale.  Neurological:     Mental Status: She is alert and oriented to person, place, and time.      UC Treatments / Results  Labs (all labs ordered are listed, but only abnormal results are displayed) Labs Reviewed  POCT URINALYSIS DIP (MANUAL  ENTRY) - Abnormal; Notable for the following components:  Result Value   Blood, UA trace-lysed (*)    Leukocytes, UA Trace (*)    All other components within normal limits  URINE CULTURE    EKG   Radiology No results found.  Procedures Procedures (including critical care time)  Medications Ordered in UC Medications - No data to display  Initial Impression / Assessment and Plan / UC Course  I have reviewed the triage vital signs and the nursing notes.  Pertinent labs & imaging results that were available during my care of the patient were reviewed by me and considered in my medical decision making (see chart for details).     1.  Acute cystitis with hematuria POCT urinalysis done in office, reviewed by me: Showing trace leukocytes with trace lysed RBCs.  Culture pending.  Will initiate short-term Keflex as listed below for uncomplicated cystitis.  Return precautions discussed, patient verbalized understanding and is agreeable to plan. Final Clinical Impressions(s) / UC Diagnoses   Final diagnoses:  Acute cystitis with hematuria     Discharge Instructions     Take antibiotic as prescribed. Return for worsening pain, blood in urine, back pain, fever. We will call you if your culture results show that you need to be on a different antibiotic.    ED Prescriptions    Medication Sig Dispense Auth. Provider   cephALEXin (KEFLEX) 500 MG capsule Take 1 capsule (500 mg total) by mouth 2 (two) times daily for 3 days. 6 capsule Hall-Potvin, [place], PA-C     Controlled Substance Prescriptions [place] Controlled Substance Registry consulted? Not Applicable   Quincy Sheehan, [place] 06/11/19 1730

## 2019-06-11 NOTE — Discharge Instructions (Addendum)
Take antibiotic as prescribed. Return for worsening pain, blood in urine, back pain, fever. We will call you if your culture results show that you need to be on a different antibiotic.

## 2019-06-11 NOTE — ED Triage Notes (Addendum)
Patient presents to [facility] for assessment of lower abdominal pain, malodorous urine "sometimes", and lower back pain x 1 week.  Patient c/o some nausea, denies vomiting.  C/o some constipation the last three days.

## 2019-06-13 LAB — URINE CULTURE: Culture: NO GROWTH

## 2019-06-20 DIAGNOSIS — E039 Hypothyroidism, unspecified: Secondary | ICD-10-CM | POA: Diagnosis not present

## 2019-06-20 DIAGNOSIS — J320 Chronic maxillary sinusitis: Secondary | ICD-10-CM | POA: Diagnosis not present

## 2019-06-20 DIAGNOSIS — E785 Hyperlipidemia, unspecified: Secondary | ICD-10-CM | POA: Diagnosis not present

## 2019-06-20 DIAGNOSIS — T753XXA Motion sickness, initial encounter: Secondary | ICD-10-CM | POA: Diagnosis not present

## 2019-06-20 DIAGNOSIS — Z23 Encounter for immunization: Secondary | ICD-10-CM | POA: Diagnosis not present

## 2019-06-20 DIAGNOSIS — R7303 Prediabetes: Secondary | ICD-10-CM | POA: Diagnosis not present

## 2019-06-20 DIAGNOSIS — Z0000 Encounter for general adult medical examination without abnormal findings: Secondary | ICD-10-CM | POA: Diagnosis not present

## 2019-07-03 DIAGNOSIS — H2513 Age-related nuclear cataract, bilateral: Secondary | ICD-10-CM | POA: Diagnosis not present

## 2019-07-03 DIAGNOSIS — E119 Type 2 diabetes mellitus without complications: Secondary | ICD-10-CM | POA: Diagnosis not present

## 2019-07-03 DIAGNOSIS — H35372 Puckering of macula, left eye: Secondary | ICD-10-CM | POA: Diagnosis not present

## 2019-07-06 DIAGNOSIS — R7303 Prediabetes: Secondary | ICD-10-CM | POA: Diagnosis not present

## 2019-07-06 DIAGNOSIS — R202 Paresthesia of skin: Secondary | ICD-10-CM | POA: Diagnosis not present

## 2019-07-06 DIAGNOSIS — E785 Hyperlipidemia, unspecified: Secondary | ICD-10-CM | POA: Diagnosis not present

## 2019-10-22 ENCOUNTER — Ambulatory Visit: Payer: Federal, State, Local not specified - PPO | Attending: Internal Medicine

## 2019-10-22 DIAGNOSIS — U071 COVID-19: Secondary | ICD-10-CM

## 2019-10-22 DIAGNOSIS — R238 Other skin changes: Secondary | ICD-10-CM

## 2019-10-23 LAB — NOVEL CORONAVIRUS, NAA: SARS-CoV-2, NAA: NOT DETECTED

## 2019-11-29 ENCOUNTER — Ambulatory Visit: Payer: No Typology Code available for payment source | Attending: Internal Medicine

## 2019-11-29 DIAGNOSIS — Z20822 Contact with and (suspected) exposure to covid-19: Secondary | ICD-10-CM | POA: Insufficient documentation

## 2019-11-30 LAB — NOVEL CORONAVIRUS, NAA: SARS-CoV-2, NAA: NOT DETECTED

## 2019-12-03 ENCOUNTER — Encounter (HOSPITAL_COMMUNITY): Payer: Self-pay | Admitting: Emergency Medicine

## 2019-12-03 ENCOUNTER — Emergency Department (HOSPITAL_COMMUNITY)
Admission: EM | Admit: 2019-12-03 | Discharge: 2019-12-03 | Disposition: A | Discharge status: Home / Self Care | Payer: No Typology Code available for payment source | Attending: Emergency Medicine | Admitting: Emergency Medicine

## 2019-12-03 ENCOUNTER — Other Ambulatory Visit: Payer: Self-pay

## 2019-12-03 DIAGNOSIS — E039 Hypothyroidism, unspecified: Secondary | ICD-10-CM | POA: Insufficient documentation

## 2019-12-03 DIAGNOSIS — M549 Dorsalgia, unspecified: Secondary | ICD-10-CM | POA: Diagnosis present

## 2019-12-03 DIAGNOSIS — Z79899 Other long term (current) drug therapy: Secondary | ICD-10-CM | POA: Insufficient documentation

## 2019-12-03 DIAGNOSIS — B029 Zoster without complications: Secondary | ICD-10-CM | POA: Diagnosis not present

## 2019-12-03 MED ORDER — PREDNISONE 20 MG PO TABS: 40.0000 mg | ORAL_TABLET | Freq: Every day | ORAL | 0 refills | Status: DC

## 2019-12-03 MED ORDER — HYDROCODONE-ACETAMINOPHEN 5-325 MG PO TABS: 1.0000 | ORAL_TABLET | Freq: Four times a day (QID) | ORAL | 0 refills | Status: DC | PRN

## 2019-12-03 MED ORDER — VALACYCLOVIR HCL 1 G PO TABS: 1000.0000 mg | ORAL_TABLET | Freq: Three times a day (TID) | ORAL | 0 refills | Status: DC

## 2019-12-03 NOTE — ED Provider Notes (Signed)
[facility] DEPARTMENT Provider Note   CSN: [id number] Arrival date & time: 12/03/19  0429     History Chief Complaint  Patient presents with  . Back Pain    [name] is a 64 y.o. female.  Patient presents to the ED with a chief complaint of left sided back pain.  She states that she was lifting something on Wednesday of last week and thinks she hurt her back. Reports taking Aleve and a muscle relaxer with no relief. She states that she noticed a rash on her back yesterday.  She describes the rash as very painful and burning.  She denies any fever.  Denies any rash elsewhere on her body.  There are no modifying factors.  The history is provided by the patient. No language interpreter was used.       Past Medical History:  Diagnosis Date  . Colon polyps   . GERD (gastroesophageal reflux disease)   . Goiter   . Hyperlipidemia   . Leukopenia   . Liver hemangioma   . Osteopenia   . Thrombocytopenia ([place])   . Vitamin D deficiency     Patient Active Problem List   Diagnosis Date Noted  . 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 GERD (gastroesophageal reflux disease) 12/05/2013  . Colon polyps 09/05/2013  . Adjustment disorder with mixed anxiety and depressed mood 01/09/2013  . Family history of cervical cancer 04/12/2012  . Hypothyroidism 03/21/2012  . Goiter 03/21/2012  . Hyperlipidemia 03/21/2012  . Menopause 03/21/2012  . Positive ANA (antinuclear antibody) 03/21/2012  . Leukopenia 03/21/2012  . Thrombocytopenia ([place]) 03/21/2012  . Hepatic lesion 03/21/2012  . Vitamin D deficiency 03/21/2012  . Hx of adenomatous colonic polyps 03/21/2012  . Osteopenia 04/26/2011    Past Surgical History:  Procedure Laterality Date  . MOUTH SURGERY    . THYROIDECTOMY, PARTIAL  04/2009     OB History    Gravida  2   Para  2   Term      Preterm      AB      Living        SAB      TAB      Ectopic      Multiple        Live Births              Family History  Problem Relation Age of Onset  . Cervical cancer Mother   . Diabetes Father   . Cervical cancer Sister   . Cancer Brother        brain  . Diabetes Brother   . Arthritis Sister        rheumatoid    Social History   Tobacco Use  . Smoking status: Never Smoker  . Smokeless tobacco: Never Used  Substance Use Topics  . Alcohol use: No    Alcohol/week: 0.0 standard drinks  . Drug use: No    Home Medications Prior to Admission medications   Medication Sig Start Date End Date Taking? Authorizing Provider  Calcium Carbonate-Vitamin D (CALCIUM + D PO) Take 1 tablet by mouth daily.    [provider]  Cetirizine HCl 10 MG CAPS Take 1 capsule (10 mg total) by mouth daily for 10 days. 11/20/18 11/30/18  Wieters, Elesa Hacker, PA-C  Elastic Bandages & Supports (WRIST SPLINT/COCK-UP/LEFT M) MISC 1 Device by Does not apply route daily. 02/14/17   Pieter Partridge, DO  HYDROcodone-acetaminophen (NORCO/VICODIN)  5-325 MG tablet Take 1-2 tablets by mouth every 6 (six) hours as needed. 12/03/19   Montine Circle, PA-C  levothyroxine (SYNTHROID, LEVOTHROID) 50 MCG tablet TAKE 1&1/2 TABLETS BY MOUTH ON MONDAY, WEDNESDAY, AND FRIDAY AND TAKE 1 TABLET BY MOUTH ON TUESDAY, THURSDAY, SATURDAY, AND SUNDAY 06/25/16   Pleas Koch, NP  levothyroxine (SYNTHROID, LEVOTHROID) 50 MCG tablet TAKE 1&1/2 TABLETS BY MOUTH ON MONDAY, WEDNESDAY, AND FRIDAY AND TAKE 1 TABLET BY MOUTH ON TUESDAY, THURSDAY, SATURDAY, AND SUNDAY 12/22/16   Pleas Koch, NP  LORazepam (ATIVAN) 1 MG tablet Take 1/2 tablet as needed every 12 hours.  May repeat in 30 mins if needed 12/05/13   Schoenhoff, Altamese Cabal, MD  predniSONE (DELTASONE) 20 MG tablet Take 2 tablets (40 mg total) by mouth daily. 12/03/19   Montine Circle, PA-C  simvastatin (ZOCOR) 10 MG tablet Take 1 tablet (10 mg total) by mouth at bedtime. 06/06/15   Pleas Koch, NP  valACYclovir (VALTREX) 1000 MG tablet Take 1  tablet (1,000 mg total) by mouth 3 (three) times daily. 12/03/19   Montine Circle, PA-C  Vitamin D, Ergocalciferol, (DRISDOL) 50000 UNITS CAPS capsule Take 1 capsule by mouth once weekly for a total of 12 weeks. 08/14/15   Pleas Koch, NP    Allergies    Azithromycin, Codeine, and Lactose intolerance (gi)  Review of Systems   Review of Systems  All other systems reviewed and are negative.   Physical Exam Updated Vital Signs BP 127/83 (BP Location: Left Arm)   Pulse 79   Temp 97.6 F (36.4 C) (Oral)   Resp (!) 8   Ht 5\' 3"  (1.6 m)   Wt 68 kg   SpO2 98%   BMI 26.56 kg/m   Physical Exam Vitals and nursing note reviewed.  Constitutional:      General: She is not in acute distress.    Appearance: She is well-developed.  HENT:     Head: Normocephalic and atraumatic.  Eyes:     Conjunctiva/sclera: Conjunctivae normal.  Cardiovascular:     Rate and Rhythm: Normal rate and regular rhythm.     Heart sounds: No murmur.  Pulmonary:     Effort: Pulmonary effort is normal. No respiratory distress.     Breath sounds: Normal breath sounds.  Abdominal:     Palpations: Abdomen is soft.     Tenderness: There is no abdominal tenderness.  Musculoskeletal:        General: Normal range of motion.     Cervical back: Neck supple.  Skin:    General: Skin is warm and dry.  Neurological:     Mental Status: She is alert and oriented to person, place, and time.  Psychiatric:        Mood and Affect: Mood normal.        Behavior: Behavior normal.     ED Results / Procedures / Treatments   Labs (all labs ordered are listed, but only abnormal results are displayed) Labs Reviewed - No data to display  EKG None  Radiology No results found.  Procedures Procedures (including critical care time)  Medications Ordered in ED Medications - No data to display  ED Course  I have reviewed the triage vital signs and the nursing notes.  Pertinent labs & imaging results that were  available during my care of the patient were reviewed by me and considered in my medical decision making (see chart for details).    MDM Rules/Calculators/A&P  Patient here with left sided back pain.  Rash that popped up yesterday.  Appears characteristic of shingles. Given the new appearance of the rash, will start valtrex.  Will also send pain meds and prednisone.    Precautions for at risk populations and return precautions given.   Final Clinical Impression(s) / ED Diagnoses Final diagnoses:  Herpes zoster without complication    Rx / DC Orders ED Discharge Orders         Ordered    valACYclovir (VALTREX) 1000 MG tablet  3 times daily     12/03/19 0504    predniSONE (DELTASONE) 20 MG tablet  Daily     12/03/19 0504    HYDROcodone-acetaminophen (NORCO/VICODIN) 5-325 MG tablet  Every 6 hours PRN     12/03/19 0504           Montine Circle, PA-C 12/03/19 0510    Orpah Greek, MD 12/03/19 0630

## 2019-12-03 NOTE — ED Triage Notes (Addendum)
Pt c/o upper back pain since last Wednesday after lifting boxes, pain radiating to L rib cage. No relief from OTC meds, pt reports a red rash to same area.

## 2019-12-17 DIAGNOSIS — B029 Zoster without complications: Secondary | ICD-10-CM

## 2019-12-17 HISTORY — DX: Zoster without complications: B02.9

## 2020-02-16 DIAGNOSIS — Z8619 Personal history of other infectious and parasitic diseases: Secondary | ICD-10-CM

## 2020-02-16 HISTORY — DX: Personal history of other infectious and parasitic diseases: Z86.19

## 2020-08-01 ENCOUNTER — Other Ambulatory Visit: Payer: Self-pay | Admitting: Internal Medicine

## 2020-08-01 DIAGNOSIS — E2839 Other primary ovarian failure: Secondary | ICD-10-CM

## 2020-08-06 ENCOUNTER — Other Ambulatory Visit: Payer: Self-pay

## 2020-08-06 ENCOUNTER — Encounter: Payer: Self-pay | Admitting: Allergy

## 2020-08-06 ENCOUNTER — Ambulatory Visit (INDEPENDENT_AMBULATORY_CARE_PROVIDER_SITE_OTHER): Payer: No Typology Code available for payment source | Admitting: Allergy

## 2020-08-06 VITALS — BP 104/62 | HR 64 | Temp 98.0°F | Resp 16 | Ht 60.0 in | Wt 143.0 lb

## 2020-08-06 DIAGNOSIS — L234 Allergic contact dermatitis due to dyes: Secondary | ICD-10-CM

## 2020-08-06 DIAGNOSIS — J310 Chronic rhinitis: Secondary | ICD-10-CM | POA: Diagnosis not present

## 2020-08-06 DIAGNOSIS — K9049 Malabsorption due to intolerance, not elsewhere classified: Secondary | ICD-10-CM | POA: Diagnosis not present

## 2020-08-06 MED ORDER — AZELASTINE-FLUTICASONE 137-50 MCG/ACT NA SUSP: 1.0000 | Freq: Two times a day (BID) | NASAL | 5 refills | Status: DC

## 2020-08-06 NOTE — Progress Notes (Signed)
New Patient Note  RE: [name] MRN: [id number] DOB: 12-Mar-1956 Date of Office Visit: 08/06/2020  Referring provider: No ref. provider found Primary care provider: Leeroy Cha, MD  Chief Complaint: food allergy and skin reaction  History of present illness: [name] is a 64 y.o. female presenting today for evaluation of possible food allergy and contact dermatitis.  History and physical obtained by Dr. Lanier Prude medicine resident.  She states she has experienced hoarseness and difficulty speaking, needing to clear her throat frequently after eating pineapple over the last couple of years. She states this has occurred every time she has consumed raw pineapple in recent years, although she notes she previously did not have any reaction after eating raw or cooked pineapple in the past. She says these symptoms occur within a few minutes of eating pineapple. She also notes she has experienced stomach cramps, bloating, gas pains, diarrhea, and mood disturbance / depressed mood after consuming cow's milk. She says symptoms last about 3 days and have occurred after every time she consumed cow's milk since her late 20's. She says she has noticed a decrease in symptoms after switching to oat milk recently.   Additionally, she notes stinging of her scalp after recent shampooing of her hair a few weeks ago. She states she was at a salon and got her hair washed before it was braided, and noticed burning of her scalp with a bumpy rash she couldn't see behind her ears. Her symptoms continued for a couple of days until she washed her hair again. The same shampoo had been applied in the past without similar reaction, although she notes she had similar stinging with hair dyes in the past and has gotten similar bumps with darkening and peeling of her lips with use of any lipstick other than Clinique. She has been told historically that she's had contact dermatitis.  She also notes seasonal  allergies, with mild rhinorrhea, post-nasal drip, facial congestion and tightness behind her eyes, associated with headache. She says this has been going on for years, and is worse in the fall and the spring. She also believes her congestion had been worse in the past around mold in her home that has improved since being treated. She takes Claritin and Flonase as needed with significant improvement at home. She denies ever having any allergen testing.   Review of systems:  All other systems negative unless noted above in HPI. Specifically, no shortness of breath, wheezing, coughing, ear pain, ear itching, fevers or chills.   Past medical history: Past Medical History:  Diagnosis Date   Colon polyps    GERD (gastroesophageal reflux disease)    Goiter    Hyperlipidemia    Leukopenia    Liver hemangioma    Osteopenia    Shingles 12/2019   Thrombocytopenia (HCC)    Vitamin D deficiency     Past surgical history: Past Surgical History:  Procedure Laterality Date   MOUTH SURGERY     THYROIDECTOMY, PARTIAL  04/2009    Family history:  Family History  Problem Relation Age of Onset   Cervical cancer Mother    Diabetes Father    Cervical cancer Sister    Cancer Brother        brain   Diabetes Brother    Arthritis Sister        rheumatoid   Lupus Sister    Lupus Maternal Aunt    Allergic rhinitis Daughter    Asthma Neg Hx  Eczema Neg Hx    Urticaria Neg Hx    Immunodeficiency Neg Hx    Atopy Neg Hx    Angioedema Neg Hx   Patient's sister had seasonal allergies.   Social history: Lives in a home with carpeting with gas heating and central cooling.  No pets in the home.  There is no concern for water damage, mildew or roaches in the home.  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  She denies a smoking history.  Medication List: Current Outpatient Medications  Medication  Sig Dispense Refill   levothyroxine (SYNTHROID, LEVOTHROID) 50 MCG tablet TAKE 1&1/2 TABLETS BY MOUTH ON MONDAY, WEDNESDAY, AND FRIDAY AND TAKE 1 TABLET BY MOUTH ON TUESDAY, THURSDAY, SATURDAY, AND SUNDAY 34 tablet 0   LORazepam (ATIVAN) 1 MG tablet Take 1/2 tablet as needed every 12 hours.  May repeat in 30 mins if needed 20 tablet 1   Magnesium 300 MG CAPS Take by mouth.     simvastatin (ZOCOR) 10 MG tablet Take 1 tablet (10 mg total) by mouth at bedtime. 90 tablet 3   Vitamin D, Ergocalciferol, (DRISDOL) 50000 UNITS CAPS capsule Take 1 capsule by mouth once weekly for a total of 12 weeks. 4 capsule 2   No current facility-administered medications for this visit.    Known medication allergies: Allergies  Allergen Reactions   Azithromycin Nausea And Vomiting   Codeine Nausea Only   Lactose Intolerance (Gi)      Physical examination: Blood pressure 104/62, pulse 64, temperature 98 F (36.7 C), temperature source Temporal, resp. rate 16, height 5' (1.524 m), weight 143 lb (64.9 kg), SpO2 96 %.  General: Alert, interactive, in no acute distress. HEENT: TMs pearly gray, turbinates minimally edematous without discharge, post-pharynx unremarkable. Neck: Supple without lymphadenopathy. Lungs: Clear to auscultation without wheezing, rhonchi or rales. {no increased work of breathing. CV: Normal S1, S2 without murmurs. Abdomen: Nondistended, nontender. Skin: Warm and dry, without lesions or rashes. Extremities:  No clubbing, cyanosis or edema. Neuro:   Grossly intact.  Diagnositics/Labs:  Allergy testing: Skin prick allergy testing to environmental allergens and specific foods (milk and pineapple) were tested, although histamine control and all other sites of testing were non-reactive.  Assessment and plan:   Contact Dermatitis:  - recommend performing patch testing to see if we can determine what you are reactive to with contact exposure (especially ingredients found in hair  products).  Patch testing is best placed on a Monday with return to office on Wednesday and Friday of same week for readings.  Once patches are placed on your back you should not get them wet. You can continue antihistamines and other medications while performing patch testing. You can schedule for patch placement on a Monday at your convenience.   Food intolerance/allergy: - environmental and food allergy skin testing today was non-reactive.  Thus will obtain these panels via serum IgE with labwork - continue avoidance of pineapple and dairy.   Dairy represents a lactose intolerance thus you could try use of a lactose pill prior to dairy ingestion to prevent GI symptoms.  - if testing for pineapple returns positive then will set you up with an epinephrine device to have in case of allergic reaction  Rhinitis: - continue Claritin 10mg  daily as needed - trial dymista 1 spray each nostril twice a day.  This is a combination nasal spray with Flonase + Astelin (nasal antihistamine).  This helps with  both nasal congestion and drainage.  If this is not covered under your insurance plan then will prescribe Astelin separately.  Follow-up in 6 months or sooner for patch testing   Kimberly Bennett, MD [facility] medicine resident  Attestation:   I appreciate the opportunity to take part in [name]'s care. Please call our office with any questions or concerns.   I performed a history and physical examination of the patient and discussed management with the resident. I reviewed the residents note and agree with the documented findings and plan of care. The note in its entirety was edited by myself, including the physical exam, assessment, and plan.   Prudy Feeler, MD [facility] [place] [facility] [place]

## 2020-08-06 NOTE — Patient Instructions (Addendum)
Contact Dermatitis:  - recommend performing patch testing to see if we can determine what you are reactive to with contact exposure (especially ingredients found in hair products).  Patch testing is best placed on a Monday with return to office on Wednesday and Friday of same week for readings.  Once patches are placed on your back you should not get them wet. You can continue antihistamines and other medications while performing patch testing. You can schedule for patch placement on a Monday at your convenience.   Food intolerance/allergy: - environmental and food allergy skin testing today was non-reactive.  Thus will obtain these panels via serum IgE with labwork - continue avoidance of pineapple and dairy.   Dairy represents a lactose intolerance thus you could try use of a lactose pill prior to dairy ingestion to prevent GI symptoms.  - if testing for pineapple returns positive then will set you up with an epinephrine device to have in case of allergic reaction  Rhinitis: - continue Claritin 10mg  daily as needed - trial dymista 1 spray each nostril twice a day.  This is a combination nasal spray with Flonase + Astelin (nasal antihistamine).  This helps with both nasal congestion and drainage.  If this is not covered under your insurance plan then will prescribe Astelin separately.  Follow-up in 6 months or sooner for patch testing

## 2020-08-10 LAB — ALLERGENS W/TOTAL IGE AREA 2

## 2020-08-10 LAB — ALLERGEN, PINEAPPLE, F210: Pineapple IgE: <0.1 kU/L

## 2020-08-10 LAB — ALLERGEN MILK: Milk IgE: <0.1 kU/L

## 2020-08-20 ENCOUNTER — Other Ambulatory Visit: Payer: Self-pay | Admitting: Internal Medicine

## 2020-11-03 ENCOUNTER — Ambulatory Visit: Payer: No Typology Code available for payment source | Admitting: Allergy

## 2020-11-14 ENCOUNTER — Other Ambulatory Visit: Payer: No Typology Code available for payment source

## 2020-11-25 ENCOUNTER — Encounter: Payer: Self-pay | Admitting: *Deleted

## 2020-11-25 ENCOUNTER — Other Ambulatory Visit: Payer: Self-pay | Admitting: *Deleted

## 2020-11-26 ENCOUNTER — Ambulatory Visit (INDEPENDENT_AMBULATORY_CARE_PROVIDER_SITE_OTHER): Payer: No Typology Code available for payment source | Admitting: Diagnostic Neuroimaging

## 2020-11-26 ENCOUNTER — Encounter: Payer: Self-pay | Admitting: Diagnostic Neuroimaging

## 2020-11-26 VITALS — BP 106/71 | HR 72 | Ht 64.0 in | Wt 139.4 lb

## 2020-11-26 DIAGNOSIS — G5602 Carpal tunnel syndrome, left upper limb: Secondary | ICD-10-CM

## 2020-11-26 NOTE — Patient Instructions (Signed)
-   referral to hand surgery; failed conservative mgmt (wrist splint); symptoms progressively worsening since 2017

## 2020-11-26 NOTE — Progress Notes (Signed)
[facility]  PATIENT: [name] DOB: [birth year]  REFERRING CLINICIAN: Leeroy Cha,* HISTORY FROM: patient  REASON FOR VISIT: new consult    HISTORICAL  CHIEF COMPLAINT:  Chief Complaint  Patient presents with  . Paresthesia of Left Arm    Rm 6 New Pt "numbness/tingling in my arm down to my hand, sometimes both hands are numb and in pain; sometimes I feel tingling down my left side down to legs- occurs during the night"     HISTORY OF PRESENT ILLNESS:   65 year old female here for evaluation of left hand numbness.  Symptoms started in 2017.  She saw Dr. Tomi Likens in 2018, had EMG by Dr. Posey Pronto, which confirmed left carpal tunnel syndrome.  This was treated conservatively with response.  Symptoms were stable for several years.  However in the last 1 to 2 years symptoms have worsened.  She describes waking up with numbness and tingling in her left hand.  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  She is not been using response lately.  She is interested in additional surgery or procedure to help this carpal tunnel syndrome problem.   REVIEW OF SYSTEMS: Full 14 system review of systems performed and negative with exception of: As per HPI.  ALLERGIES: Allergies  Allergen Reactions  . Azithromycin Nausea And Vomiting    diarrhea  . Codeine Nausea Only    jittery  . Lactose Intolerance (Gi)     HOME MEDICATIONS: Outpatient Medications Prior to Visit  Medication Sig Dispense Refill  . fluticasone (FLONASE) 50 MCG/ACT nasal spray SHAKE LIQUID AND USE 1 SPRAY IN EACH NOSTRIL EVERY DAY    . levothyroxine (SYNTHROID, LEVOTHROID) 50 MCG tablet TAKE 1&1/2 TABLETS BY MOUTH ON MONDAY, WEDNESDAY, AND FRIDAY AND TAKE 1 TABLET BY MOUTH ON TUESDAY, THURSDAY, SATURDAY, AND SUNDAY 34 tablet 0  . simvastatin (ZOCOR) 10 MG tablet Take 1 tablet (10 mg total) by mouth at bedtime. 90 tablet 3  . triamcinolone (KENALOG)  0.1 % Apply topically 2 (two) times daily.    . Azelastine-Fluticasone 137-50 MCG/ACT SUSP Place 1 spray into both nostrils in the morning and at bedtime. 23 g 5  . aspirin 81 MG chewable tablet daily. (Patient not taking: Reported on 11/26/2020)    . LORazepam (ATIVAN) 1 MG tablet Take 1/2 tablet as needed every 12 hours.  May repeat in 30 mins if needed (Patient not taking: Reported on 11/26/2020) 20 tablet 1  . Magnesium 300 MG CAPS Take by mouth. (Patient not taking: Reported on 11/26/2020)    . tiZANidine (ZANAFLEX) 2 MG tablet 1 tablet as needed (Patient not taking: Reported on 11/26/2020)    . Vitamin D, Ergocalciferol, (DRISDOL) 50000 UNITS CAPS capsule Take 1 capsule by mouth once weekly for a total of 12 weeks. (Patient not taking: Reported on 11/26/2020) 4 capsule 2  . gabapentin (NEURONTIN) 100 MG capsule 1 capsule    . rosuvastatin (CRESTOR) 20 MG tablet 1 tablet     No facility-administered medications prior to visit.    PAST MEDICAL HISTORY: Past Medical History:  Diagnosis Date  . Anxiety   . Colon polyps   . GERD (gastroesophageal reflux disease)   . Goiter   . History of shingles 02/2020  . Hyperlipidemia   . Hypothyroidism   . Left carpal tunnel syndrome   . Leukopenia   . Liver hemangioma   . Osteopenia   . Paresthesia of  left arm   . Shingles 12/2019  . Thrombocytopenia ([place])   . Vitamin D deficiency     PAST SURGICAL HISTORY: Past Surgical History:  Procedure Laterality Date  . MOUTH SURGERY    . TOTAL THYROIDECTOMY  04/2009    FAMILY HISTORY: Family History  Problem Relation Age of Onset  . Cervical cancer Mother   . Diabetes Father   . Cervical cancer Sister   . Diabetes Sister   . Cancer Brother        brain  . Diabetes Brother   . Arthritis Sister        rheumatoid  . Lupus Sister   . Lupus Maternal Aunt   . Allergic rhinitis Daughter   . Asthma Neg Hx   . Eczema Neg Hx   . Urticaria Neg Hx   . Immunodeficiency Neg Hx   . Atopy Neg Hx   .  Angioedema Neg Hx     SOCIAL HISTORY: Social History   Socioeconomic History  . Marital status: Married    Spouse name: Eddie Dibbles  . Number of children: 2  . Years of education: 47  . Highest education level: Associate degree: occupational, Hotel manager, or vocational program  Occupational History  . Occupation: Works for [employer]: [place] [employer]  Tobacco Use  . Smoking status: Never Smoker  . Smokeless tobacco: Never Used  Vaping Use  . Vaping Use: Never used  Substance and Sexual Activity  . Alcohol use: No    Alcohol/week: 0.0 standard drinks  . Drug use: Never  . Sexual activity: Yes    Birth control/protection: Post-menopausal  Other Topics Concern  . Not on file  Social History Narrative   Married.   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    Lives in a 2 story home.     Education: some college   Caffeine- 2-4 daily coffee/tea   Social Determinants of Radio broadcast assistant Strain: Not on file  Food Insecurity: Not on file  Transportation Needs: Not on file  Physical Activity: Not on file  Stress: Not on file  Social Connections: Not on file  Intimate Partner Violence: Not on file     PHYSICAL EXAM  GENERAL EXAM/CONSTITUTIONAL: Vitals:  Vitals:   11/26/20 0848  BP: 106/71  Pulse: 72  Weight: 139 lb 6.4 oz (63.2 kg)  Height: 5\' 4"  (1.626 m)   Body mass index is 23.93 kg/m. Wt Readings from Last 3 Encounters:  11/26/20 139 lb 6.4 oz (63.2 kg)  08/06/20 143 lb (64.9 kg)  12/03/19 149 lb 14.6 oz (68 kg)    Patient is in no distress; well developed, nourished and groomed; neck is supple  CARDIOVASCULAR:  Examination of carotid arteries is normal; no carotid bruits  Regular rate and rhythm, no murmurs  Examination of peripheral vascular system by observation and palpation is normal  EYES:  Ophthalmoscopic exam of optic discs and posterior segments is normal; no papilledema or  hemorrhages No exam data present  MUSCULOSKELETAL:  Gait, strength, tone, movements noted in Neurologic exam below  NEUROLOGIC: MENTAL STATUS:  No flowsheet data found.  awake, alert, oriented to person, place and time  recent and remote memory intact  normal attention and concentration  language fluent, comprehension intact, naming intact  fund of knowledge appropriate  CRANIAL NERVE:   2nd - no papilledema on fundoscopic exam  2nd, 3rd, 4th, 6th - pupils equal  and reactive to light, visual fields full to confrontation, extraocular muscles intact, no nystagmus  5th - facial sensation symmetric  7th - facial strength symmetric  8th - hearing intact  9th - palate elevates symmetrically, uvula midline  11th - shoulder shrug symmetric  12th - tongue protrusion midline  MOTOR:   normal bulk and tone, full strength in the BUE, BLE; MILD ATROPHY OF LEFT APB  SENSORY:   normal and symmetric to light touch, temperature, vibration  MILD PHALEN'S SIGN ON LEFT; NEG TINEL'S  COORDINATION:   finger-nose-finger, fine finger movements normal  REFLEXES:   deep tendon reflexes TRACE and symmetric  GAIT/STATION:   narrow based gait     DIAGNOSTIC DATA (LABS, IMAGING, TESTING) - I reviewed patient records, labs, notes, testing and imaging myself where available.  Lab Results  Component Value Date   WBC 3.3 (L) 07/31/2014   HGB 13.3 07/31/2014   HCT 40.1 07/31/2014   MCV 90.1 07/31/2014   PLT 179 07/31/2014      Component Value Date/Time   NA 141 08/08/2015 0829   K 3.9 08/08/2015 0829   CL 104 08/08/2015 0829   CO2 32 08/08/2015 0829   GLUCOSE 93 08/08/2015 0829   BUN 16 08/08/2015 0829   CREATININE 1.01 08/08/2015 0829   CREATININE 0.91 07/19/2014 0816   CALCIUM 9.3 08/08/2015 0829   PROT 7.2 08/08/2015 0829   ALBUMIN 4.0 08/08/2015 0829   AST 20 08/08/2015 0829   ALT 15 08/08/2015 0829   ALKPHOS 66 08/08/2015 0829   BILITOT 0.4 08/08/2015 0829    GFRNONAA 70 07/19/2014 0816   GFRAA 81 07/19/2014 0816   Lab Results  Component Value Date   CHOL 201 (H) 08/08/2015   HDL 60.60 08/08/2015   LDLCALC 128 (H) 08/08/2015   TRIG 60.0 08/08/2015   CHOLHDL 3 08/08/2015   No results found for: HGBA1C No results found for: VITAMINB12 Lab Results  Component Value Date   TSH 0.97 08/08/2015    02/24/17 EMG/NCS (Dr. Posey Pronto) - Left median neuropathy at or distal to the wrist, consistent with clinical diagnosis of carpal tunnel syndrome. Overall, these findings are mild in degree electrically.  11/25/16 CT cervical spine - There is no demonstrable nerve root edema or effacement on this noncontrast enhanced study. No disc extrusion or stenosis evident. No fracture or spondylolisthesis. Rather minimal osteoarthritic change in the facets at several levels as noted above. - Patient is status post thyroidectomy.   ASSESSMENT AND PLAN  65 y.o. year old female here with left carpal tunnel syndrome since 2017, confirmed on EMG in 2018.  Dx:  1. Left carpal tunnel syndrome      PLAN:  - referral to hand surgery; failed conservative mgmt (wrist splint); symptoms progressively worsening since 2017  Orders Placed This Encounter  Procedures  . Ambulatory referral to Hand Surgery   Return refer to hand surgery clinic, for return to PCP.    Penni Bombard, MD 11/22/5807, 9:83 AM Certified in Neurology, Neurophysiology and Neuroimaging  [facility] [address] [place], [place] [address] [phone]

## 2021-04-16 ENCOUNTER — Ambulatory Visit
Admission: RE | Admit: 2021-04-16 | Discharge: 2021-04-16 | Disposition: A | Discharge status: Home / Self Care | Payer: No Typology Code available for payment source | Source: Ambulatory Visit | Attending: Internal Medicine | Admitting: Internal Medicine

## 2021-04-16 ENCOUNTER — Other Ambulatory Visit: Payer: Self-pay

## 2021-04-16 DIAGNOSIS — E2839 Other primary ovarian failure: Secondary | ICD-10-CM

## 2022-05-28 ENCOUNTER — Other Ambulatory Visit: Payer: Self-pay | Admitting: Internal Medicine

## 2022-05-28 DIAGNOSIS — Z1231 Encounter for screening mammogram for malignant neoplasm of breast: Secondary | ICD-10-CM

## 2022-07-01 ENCOUNTER — Ambulatory Visit
Admission: RE | Admit: 2022-07-01 | Discharge: 2022-07-01 | Disposition: A | Discharge status: Home / Self Care | Payer: No Typology Code available for payment source | Source: Ambulatory Visit | Attending: Internal Medicine | Admitting: Internal Medicine

## 2022-07-01 DIAGNOSIS — Z1231 Encounter for screening mammogram for malignant neoplasm of breast: Secondary | ICD-10-CM

## 2023-07-04 ENCOUNTER — Other Ambulatory Visit: Payer: Self-pay | Admitting: Internal Medicine

## 2023-07-04 DIAGNOSIS — Z0000 Encounter for general adult medical examination without abnormal findings: Secondary | ICD-10-CM

## 2023-07-15 ENCOUNTER — Ambulatory Visit
Admission: RE | Admit: 2023-07-15 | Discharge: 2023-07-15 | Disposition: A | Discharge status: Home / Self Care | Payer: Medicare Other | Source: Ambulatory Visit | Attending: Internal Medicine | Admitting: Internal Medicine

## 2023-07-15 DIAGNOSIS — Z0000 Encounter for general adult medical examination without abnormal findings: Secondary | ICD-10-CM

## 2023-08-01 ENCOUNTER — Ambulatory Visit
Admission: EM | Admit: 2023-08-01 | Discharge: 2023-08-01 | Disposition: A | Discharge status: Home / Self Care | Payer: Medicare Other | Attending: Emergency Medicine | Admitting: Emergency Medicine

## 2023-08-01 DIAGNOSIS — R0981 Nasal congestion: Secondary | ICD-10-CM | POA: Diagnosis not present

## 2023-08-01 DIAGNOSIS — B349 Viral infection, unspecified: Secondary | ICD-10-CM

## 2023-08-01 MED ORDER — FLUTICASONE PROPIONATE 50 MCG/ACT NA SUSP: 1.0000 | Freq: Every day | NASAL | 0 refills | Status: AC

## 2023-08-01 NOTE — ED Provider Notes (Signed)
[name]    CSN: [id number] Arrival date & time: 08/01/23  1000      History   Chief Complaint Chief Complaint  Patient presents with   Nasal Congestion   Headache    HPI [name] is a 67 y.o. female.   67 year old female, [name], presents to urgent care for nasal congestion, headache x 2 days,clear drainage. No treatment tried.  The history is provided by the patient. No language interpreter was used.    Past Medical History:  Diagnosis Date   Anxiety    Colon polyps    GERD (gastroesophageal reflux disease)    Goiter    History of shingles 02/2020   Hyperlipidemia    Hypothyroidism    Left carpal tunnel syndrome    Leukopenia    Liver hemangioma    Osteopenia    Paresthesia of left arm    Shingles 12/2019   Thrombocytopenia (HCC)    Vitamin D deficiency     Patient Active Problem List   Diagnosis Date Noted   Nonspecific syndrome suggestive of viral illness 08/01/2023   Nasal congestion 08/01/2023   Short PR-normal QRS complex syndrome 12/05/2013   GERD (gastroesophageal reflux disease) 12/05/2013   Colon polyps 09/05/2013   Adjustment disorder with mixed anxiety and depressed mood 01/09/2013   Family history of cervical cancer 04/12/2012   Hypothyroidism 03/21/2012   Goiter 03/21/2012   Hyperlipidemia 03/21/2012   Menopause 03/21/2012   Positive ANA (antinuclear antibody) 03/21/2012   Leukopenia 03/21/2012   Thrombocytopenia (HCC) 03/21/2012   Hepatic lesion 03/21/2012   Vitamin D deficiency 03/21/2012   Hx of adenomatous colonic polyps 03/21/2012   Osteopenia 04/26/2011    Past Surgical History:  Procedure Laterality Date   MOUTH SURGERY     TOTAL THYROIDECTOMY  04/2009    OB History     Gravida  2   Para  2   Term      Preterm      AB      Living         SAB      IAB      Ectopic      Multiple      Live Births               Home Medications    Prior to Admission medications    Medication Sig Start Date End Date Taking? Authorizing Provider  levothyroxine (SYNTHROID, LEVOTHROID) 50 MCG tablet TAKE 1&1/2 TABLETS BY MOUTH ON MONDAY, WEDNESDAY, AND FRIDAY AND TAKE 1 TABLET BY MOUTH ON TUESDAY, THURSDAY, SATURDAY, AND SUNDAY 12/22/16  Yes Doreene Nest, NP  simvastatin (ZOCOR) 10 MG tablet Take 1 tablet (10 mg total) by mouth at bedtime. 06/06/15  Yes Doreene Nest, NP  tiZANidine (ZANAFLEX) 2 MG tablet  07/06/19  Yes [provider]  aspirin 81 MG chewable tablet daily. Patient not taking: Reported on 11/26/2020    [provider]  fluticasone (FLONASE) 50 MCG/ACT nasal spray Place 1 spray into both nostrils daily. 08/01/23   Shantele Reller, Para March, NP  LORazepam (ATIVAN) 1 MG tablet Take 1/2 tablet as needed every 12 hours.  May repeat in 30 mins if needed Patient not taking: Reported on 11/26/2020 12/05/13   Schoenhoff, Harrington Challenger, MD  Magnesium 300 MG CAPS Take by mouth. Patient not taking: Reported on 11/26/2020    [provider]  triamcinolone (KENALOG) 0.1 % Apply topically 2 (two) times daily. 11/19/20  [provider]  Vitamin D, Ergocalciferol, (DRISDOL) 50000 UNITS CAPS capsule Take 1 capsule by mouth once weekly for a total of 12 weeks. Patient not taking: Reported on 11/26/2020 08/14/15   Doreene Nest, NP    Family History Family History  Problem Relation Age of Onset   Cervical cancer Mother    Diabetes Father    Cervical cancer Sister    Diabetes Sister    Cancer Brother        brain   Diabetes Brother    Arthritis Sister        rheumatoid   Lupus Sister    Lupus Maternal Aunt    Allergic rhinitis Daughter    Asthma Neg Hx    Eczema Neg Hx    Urticaria Neg Hx    Immunodeficiency Neg Hx    Atopy Neg Hx    Angioedema Neg Hx     Social History Social History   Tobacco Use   Smoking status: Never   Smokeless tobacco: Never  Vaping Use   Vaping status: Never Used  Substance Use Topics   Alcohol use:  No    Alcohol/week: 0.0 standard drinks of alcohol   Drug use: Never     Allergies   Azithromycin, Codeine, and Lactose intolerance (gi)   Review of Systems Review of Systems  HENT:  Positive for congestion and postnasal drip.   Neurological:  Positive for headaches.  All other systems reviewed and are negative.    Physical Exam Triage Vital Signs ED Triage Vitals  Encounter Vitals Group     BP      Systolic BP Percentile      Diastolic BP Percentile      Pulse      Resp      Temp      Temp src      SpO2      Weight      Height      Head Circumference      Peak Flow      Pain Score      Pain Loc      Pain Education      Exclude from Growth Chart    No data found.  Updated Vital Signs BP 127/87 (BP Location: Left Arm)   Pulse 88   Temp 97.9 F (36.6 C) (Oral)   Resp 16   SpO2 96%   Visual Acuity Right Eye Distance:   Left Eye Distance:   Bilateral Distance:    Right Eye Near:   Left Eye Near:    Bilateral Near:     Physical Exam Vitals and nursing note reviewed.  Constitutional:      Appearance: Normal appearance. She is well-developed and well-groomed.  HENT:     Head: Normocephalic.     Right Ear: Tympanic membrane is retracted.     Left Ear: Tympanic membrane is retracted.     Nose: Congestion present.     Right Sinus: No maxillary sinus tenderness or frontal sinus tenderness.     Left Sinus: No maxillary sinus tenderness or frontal sinus tenderness.     Mouth/Throat:     Lips: Pink.     Mouth: Mucous membranes are moist.     Pharynx: Oropharynx is clear. Uvula midline.  Cardiovascular:     Rate and Rhythm: Normal rate and regular rhythm.     Pulses: Normal pulses.     Heart sounds: Normal heart sounds.  Pulmonary:  Effort: Pulmonary effort is normal.     Breath sounds: Normal breath sounds and air entry.  Neurological:     General: No focal deficit present.     Mental Status: She is alert and oriented to person, place, and time.      GCS: GCS eye subscore is 4. GCS verbal subscore is 5. GCS motor subscore is 6.     Cranial Nerves: No cranial nerve deficit.     Sensory: No sensory deficit.  Psychiatric:        Attention and Perception: Attention normal.        Mood and Affect: Mood normal.        Speech: Speech normal.        Behavior: Behavior normal. Behavior is cooperative.      UC Treatments / Results  Labs (all labs ordered are listed, but only abnormal results are displayed) Labs Reviewed - No data to display  EKG   Radiology No results found.  Procedures Procedures (including critical care time)  Medications Ordered in UC Medications - No data to display  Initial Impression / Assessment and Plan / UC Course  I have reviewed the triage vital signs and the nursing notes.  Pertinent labs & imaging results that were available during my care of the patient were reviewed by me and considered in my medical decision making (see chart for details).     Ddx: Nasal congestion, headache, allergies, viral illness Final Clinical Impressions(s) / UC Diagnoses   Final diagnoses:  Nonspecific syndrome suggestive of viral illness  Nasal congestion     Discharge Instructions      Most likely you have a viral illness: no antibiotic as indicated at this time, May treat with OTC meds of choice, take flonase as prescribed.. Make sure to drink plenty of fluids to stay hydrated(gatorade, water, popsicles,jello,etc), avoid caffeine products. Follow up with PCP. Return as needed.     ED Prescriptions     Medication Sig Dispense Auth. Provider   fluticasone (FLONASE) 50 MCG/ACT nasal spray Place 1 spray into both nostrils daily. 16 g Nameer Summer, Para March, NP      PDMP not reviewed this encounter.   Clancy Gourd, NP 08/01/23 1552

## 2023-08-01 NOTE — Discharge Instructions (Signed)
Most likely you have a viral illness: no antibiotic as indicated at this time, May treat with OTC meds of choice, take flonase as prescribed.. Make sure to drink plenty of fluids to stay hydrated(gatorade, water, popsicles,jello,etc), avoid caffeine products. Follow up with PCP. Return as needed.

## 2023-08-01 NOTE — ED Triage Notes (Signed)
Pt presents to the office for nasal congestion and headache x 2 days. Pt reports her drainage is clear.

## 2024-06-02 ENCOUNTER — Encounter (INDEPENDENT_AMBULATORY_CARE_PROVIDER_SITE_OTHER): Payer: Self-pay

## 2024-06-04 ENCOUNTER — Other Ambulatory Visit: Payer: Self-pay | Admitting: Internal Medicine

## 2024-06-04 DIAGNOSIS — Z1231 Encounter for screening mammogram for malignant neoplasm of breast: Secondary | ICD-10-CM

## 2024-07-16 ENCOUNTER — Ambulatory Visit
Admission: RE | Admit: 2024-07-16 | Discharge: 2024-07-16 | Disposition: A | Discharge status: Home / Self Care | Source: Ambulatory Visit | Attending: Internal Medicine | Admitting: Internal Medicine

## 2024-07-16 DIAGNOSIS — Z1231 Encounter for screening mammogram for malignant neoplasm of breast: Secondary | ICD-10-CM
